# Patient Record
Sex: Male | Born: 1935 | Race: White | Hispanic: No | Smoking: Former smoker
Health system: Southern US, Community
[De-identification: ages and names within clinical notes are randomized; demographics above are authoritative.]

## PROBLEM LIST (undated history)

## (undated) DIAGNOSIS — I480 Paroxysmal atrial fibrillation: Secondary | ICD-10-CM

## (undated) DIAGNOSIS — H547 Unspecified visual loss: Secondary | ICD-10-CM

## (undated) DIAGNOSIS — Z720 Tobacco use: Secondary | ICD-10-CM

## (undated) DIAGNOSIS — C439 Malignant melanoma of skin, unspecified: Secondary | ICD-10-CM

## (undated) DIAGNOSIS — I059 Rheumatic mitral valve disease, unspecified: Secondary | ICD-10-CM

## (undated) HISTORY — PX: PARTIAL HIP ARTHROPLASTY: SHX733

## (undated) HISTORY — DX: Unspecified visual loss: H54.7

## (undated) HISTORY — DX: Tobacco use: Z72.0

## (undated) HISTORY — DX: Malignant melanoma of skin, unspecified: C43.9

## (undated) HISTORY — DX: Rheumatic mitral valve disease, unspecified: I05.9

## (undated) HISTORY — PX: INGUINAL HERNIA REPAIR: SUR1180

## (undated) HISTORY — DX: Paroxysmal atrial fibrillation: I48.0

---

## 1962-11-28 HISTORY — PX: EYE SURGERY: SHX253

## 2004-10-01 ENCOUNTER — Ambulatory Visit: Payer: Self-pay | Admitting: *Deleted

## 2004-10-28 ENCOUNTER — Ambulatory Visit: Payer: Self-pay | Admitting: Cardiology

## 2004-11-25 ENCOUNTER — Ambulatory Visit: Payer: Self-pay | Admitting: *Deleted

## 2004-12-10 ENCOUNTER — Ambulatory Visit: Payer: Self-pay | Admitting: *Deleted

## 2004-12-31 ENCOUNTER — Ambulatory Visit: Payer: Self-pay | Admitting: Internal Medicine

## 2005-01-10 ENCOUNTER — Ambulatory Visit: Payer: Self-pay | Admitting: Cardiology

## 2005-02-07 ENCOUNTER — Ambulatory Visit: Payer: Self-pay | Admitting: *Deleted

## 2005-02-21 ENCOUNTER — Ambulatory Visit: Payer: Self-pay | Admitting: Cardiology

## 2005-03-25 ENCOUNTER — Ambulatory Visit: Payer: Self-pay | Admitting: Cardiology

## 2005-05-06 ENCOUNTER — Ambulatory Visit: Payer: Self-pay | Admitting: Cardiology

## 2005-06-08 ENCOUNTER — Ambulatory Visit: Payer: Self-pay | Admitting: *Deleted

## 2005-07-11 ENCOUNTER — Ambulatory Visit: Payer: Self-pay | Admitting: Cardiology

## 2005-08-12 ENCOUNTER — Ambulatory Visit: Payer: Self-pay | Admitting: Cardiology

## 2005-09-15 ENCOUNTER — Ambulatory Visit: Payer: Self-pay | Admitting: Cardiology

## 2005-10-24 ENCOUNTER — Ambulatory Visit: Payer: Self-pay | Admitting: *Deleted

## 2005-12-05 ENCOUNTER — Ambulatory Visit: Payer: Self-pay | Admitting: *Deleted

## 2006-01-12 ENCOUNTER — Ambulatory Visit: Payer: Self-pay | Admitting: Cardiology

## 2006-02-13 ENCOUNTER — Ambulatory Visit: Payer: Self-pay | Admitting: Internal Medicine

## 2006-03-23 ENCOUNTER — Ambulatory Visit: Payer: Self-pay | Admitting: *Deleted

## 2006-04-17 ENCOUNTER — Ambulatory Visit: Payer: Self-pay | Admitting: *Deleted

## 2006-05-18 ENCOUNTER — Ambulatory Visit: Payer: Self-pay | Admitting: Cardiology

## 2006-06-29 ENCOUNTER — Ambulatory Visit: Payer: Self-pay | Admitting: *Deleted

## 2006-08-03 ENCOUNTER — Ambulatory Visit: Payer: Self-pay | Admitting: Cardiology

## 2006-09-06 ENCOUNTER — Ambulatory Visit: Payer: Self-pay | Admitting: Cardiology

## 2006-10-12 ENCOUNTER — Ambulatory Visit: Payer: Self-pay | Admitting: Cardiology

## 2006-11-08 ENCOUNTER — Ambulatory Visit: Payer: Self-pay | Admitting: Cardiovascular Disease

## 2007-01-15 ENCOUNTER — Ambulatory Visit: Payer: Self-pay | Admitting: Cardiology

## 2007-02-14 ENCOUNTER — Ambulatory Visit: Payer: Self-pay | Admitting: Cardiology

## 2007-03-16 ENCOUNTER — Ambulatory Visit: Payer: Self-pay | Admitting: Internal Medicine

## 2007-03-22 ENCOUNTER — Ambulatory Visit: Payer: Self-pay | Admitting: Cardiology

## 2007-03-26 ENCOUNTER — Ambulatory Visit: Payer: Self-pay | Admitting: Internal Medicine

## 2007-03-28 ENCOUNTER — Ambulatory Visit: Payer: Self-pay | Admitting: Cardiology

## 2007-04-16 ENCOUNTER — Ambulatory Visit: Payer: Self-pay | Admitting: Cardiology

## 2007-06-21 ENCOUNTER — Ambulatory Visit: Payer: Self-pay | Admitting: Cardiology

## 2007-07-23 ENCOUNTER — Ambulatory Visit: Payer: Self-pay | Admitting: Internal Medicine

## 2007-08-13 ENCOUNTER — Ambulatory Visit: Payer: Self-pay | Admitting: Cardiology

## 2007-09-17 ENCOUNTER — Ambulatory Visit: Payer: Self-pay | Admitting: Cardiology

## 2007-10-22 ENCOUNTER — Ambulatory Visit: Payer: Self-pay | Admitting: Cardiology

## 2007-11-29 DIAGNOSIS — C439 Malignant melanoma of skin, unspecified: Secondary | ICD-10-CM

## 2007-11-29 HISTORY — DX: Malignant melanoma of skin, unspecified: C43.9

## 2007-12-05 ENCOUNTER — Ambulatory Visit: Payer: Self-pay | Admitting: Cardiology

## 2008-01-09 ENCOUNTER — Ambulatory Visit: Payer: Self-pay | Admitting: Cardiology

## 2008-01-23 ENCOUNTER — Ambulatory Visit: Payer: Self-pay | Admitting: Cardiovascular Disease

## 2008-02-27 ENCOUNTER — Ambulatory Visit: Payer: Self-pay | Admitting: Cardiology

## 2008-04-01 ENCOUNTER — Ambulatory Visit: Payer: Self-pay | Admitting: Cardiology

## 2008-04-17 ENCOUNTER — Ambulatory Visit: Payer: Self-pay | Admitting: Cardiology

## 2008-04-28 HISTORY — PX: COLONOSCOPY: SHX174

## 2008-04-30 ENCOUNTER — Ambulatory Visit: Payer: Self-pay | Admitting: Cardiology

## 2008-06-04 ENCOUNTER — Ambulatory Visit: Payer: Self-pay | Admitting: Cardiology

## 2008-07-07 ENCOUNTER — Ambulatory Visit: Payer: Self-pay | Admitting: Cardiology

## 2008-08-13 ENCOUNTER — Ambulatory Visit: Payer: Self-pay | Admitting: Cardiology

## 2008-09-11 ENCOUNTER — Ambulatory Visit: Payer: Self-pay | Admitting: Cardiology

## 2008-10-16 ENCOUNTER — Ambulatory Visit: Payer: Self-pay | Admitting: Cardiology

## 2008-11-13 ENCOUNTER — Ambulatory Visit: Payer: Self-pay | Admitting: Cardiology

## 2008-12-24 ENCOUNTER — Ambulatory Visit: Payer: Self-pay | Admitting: Cardiology

## 2009-01-12 ENCOUNTER — Ambulatory Visit: Payer: Self-pay | Admitting: Cardiology

## 2009-02-04 ENCOUNTER — Ambulatory Visit: Payer: Self-pay | Admitting: Cardiology

## 2009-02-19 ENCOUNTER — Ambulatory Visit: Payer: Self-pay | Admitting: Cardiology

## 2009-04-06 ENCOUNTER — Ambulatory Visit: Payer: Self-pay | Admitting: Cardiology

## 2009-05-11 ENCOUNTER — Ambulatory Visit: Payer: Self-pay | Admitting: Cardiology

## 2009-06-12 ENCOUNTER — Ambulatory Visit: Payer: Self-pay | Admitting: Cardiology

## 2009-06-29 ENCOUNTER — Ambulatory Visit: Payer: Self-pay

## 2009-07-13 ENCOUNTER — Encounter: Payer: Self-pay | Admitting: *Deleted

## 2009-07-16 ENCOUNTER — Encounter: Payer: Self-pay | Admitting: Cardiology

## 2009-07-20 ENCOUNTER — Encounter (INDEPENDENT_AMBULATORY_CARE_PROVIDER_SITE_OTHER): Payer: Self-pay | Admitting: *Deleted

## 2009-07-20 LAB — CONVERTED CEMR LAB: OCCULT 1: NEGATIVE

## 2009-08-13 ENCOUNTER — Ambulatory Visit: Payer: Self-pay | Admitting: Cardiology

## 2009-09-14 ENCOUNTER — Ambulatory Visit: Payer: Self-pay | Admitting: Cardiology

## 2009-09-14 LAB — CONVERTED CEMR LAB: POC INR: 2.2

## 2009-11-09 ENCOUNTER — Encounter: Payer: Self-pay | Admitting: Cardiology

## 2010-05-25 ENCOUNTER — Encounter (INDEPENDENT_AMBULATORY_CARE_PROVIDER_SITE_OTHER): Payer: Self-pay | Admitting: *Deleted

## 2010-08-17 ENCOUNTER — Ambulatory Visit: Payer: Self-pay | Admitting: Cardiology

## 2010-08-17 ENCOUNTER — Encounter: Payer: Self-pay | Admitting: Adult Health

## 2010-08-20 ENCOUNTER — Ambulatory Visit: Payer: Self-pay | Admitting: Cardiology

## 2010-08-20 ENCOUNTER — Encounter: Payer: Self-pay | Admitting: Cardiology

## 2010-08-20 ENCOUNTER — Ambulatory Visit (HOSPITAL_COMMUNITY): Admission: RE | Admit: 2010-08-20 | Discharge: 2010-08-20 | Payer: Self-pay | Admitting: Cardiology

## 2010-08-26 ENCOUNTER — Telehealth (INDEPENDENT_AMBULATORY_CARE_PROVIDER_SITE_OTHER): Payer: Self-pay | Admitting: *Deleted

## 2010-08-27 ENCOUNTER — Encounter (INDEPENDENT_AMBULATORY_CARE_PROVIDER_SITE_OTHER): Payer: Self-pay | Admitting: *Deleted

## 2010-09-02 ENCOUNTER — Ambulatory Visit: Payer: Self-pay | Admitting: Cardiology

## 2010-09-02 DIAGNOSIS — H548 Legal blindness, as defined in USA: Secondary | ICD-10-CM | POA: Insufficient documentation

## 2010-09-02 DIAGNOSIS — C4330 Malignant melanoma of unspecified part of face: Secondary | ICD-10-CM | POA: Insufficient documentation

## 2010-09-02 DIAGNOSIS — I059 Rheumatic mitral valve disease, unspecified: Secondary | ICD-10-CM | POA: Insufficient documentation

## 2010-10-20 ENCOUNTER — Ambulatory Visit: Payer: Self-pay | Admitting: Cardiology

## 2010-10-22 ENCOUNTER — Encounter: Payer: Self-pay | Admitting: Cardiology

## 2010-12-26 LAB — CONVERTED CEMR LAB
ALT: 17 units/L
AST: 21 units/L
Alkaline Phosphatase: 50 units/L
CO2: 23 meq/L
Chloride: 104 meq/L
HCT: 42.8 %
Hemoglobin: 14 g/dL
WBC: 4.9 10*3/uL

## 2010-12-28 NOTE — Progress Notes (Signed)
Summary: Results of Echo  Phone Note Call from Patient   Caller: Patient Reason for Call: Lab or Test Results Summary of Call: patient would like results of 2 D Echo/tg Initial call taken by: Raechel Ache [facility],  August 26, 2010 11:28 AM  Follow-up for Phone Call        results given, pt verbalized understanding.  MD will discuss further at ov Follow-up by: Teressa Lower RN,  August 26, 2010 2:03 PM
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
## 2010-12-28 NOTE — Miscellaneous (Signed)
Summary: ECHO 08/17/2010  Clinical Lists Changes  Observations: Added new observation of ECHOINTERP:   Study Conclusions    - Left ventricle: The cavity size was normal. Wall thickness was     increased in a pattern of mild LVH. The estimated ejection     fraction was 55%. Although no diagnostic regional wall motion     abnormality was identified, this possibility cannot be completely     excluded on the basis of this study. The study is not technically     sufficient to allow evaluation of LV diastolic function.   - Mitral valve: Mild regurgitation.   - Left atrium: The atrium was mildly dilated.   - Tricuspid valve: Mild regurgitation.   - Pericardium, extracardiac: There was no pericardial effusion.   Transthoracic echocardiography. M-mode, complete 2D, spectral   Doppler, and color Doppler. Height: Height: 190.5cm. Height: 75in.   Weight: Weight: 97.1kg. Weight: 213.6lb. Body mass index: BMI:   26.7kg/m^2. Body surface area: BSA: 2.58m^2. Patient status:   Outpatient. Location: Echo laboratory.    --------------------------------------------------------------------  (08/20/2010 10:29)      Echocardiogram  Procedure date:  08/20/2010  Findings:        Study Conclusions    - Left ventricle: The cavity size was normal. Wall thickness was     increased in a pattern of mild LVH. The estimated ejection     fraction was 55%. Although no diagnostic regional wall motion     abnormality was identified, this possibility cannot be completely     excluded on the basis of this study. The study is not technically     sufficient to allow evaluation of LV diastolic function.   - Mitral valve: Mild regurgitation.   - Left atrium: The atrium was mildly dilated.   - Tricuspid valve: Mild regurgitation.   - Pericardium, extracardiac: There was no pericardial effusion.   Transthoracic echocardiography. M-mode, complete 2D, spectral   Doppler, and color Doppler. Height: Height: 190.5cm.  Height: 75in.   Weight: Weight: 97.1kg. Weight: 213.6lb. Body mass index: BMI:   26.7kg/m^2. Body surface area: BSA: 2.59m^2. Patient status:   Outpatient. Location: Echo laboratory.    --------------------------------------------------------------------
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
## 2010-12-28 NOTE — Letter (Signed)
Summary: Appointment - Reminder 2  [facility] [place]. [address] [place] [address]   Phone: [phone]  Fax: [phone]     May 25, 2010 MRN: [id number]   Curtis Mckinney [address] [place], [place]  [address]   Dear [name],  Our records indicate that it is time to schedule a follow-up appointment.  Dr.   Dietrich Pates       recommended that you follow up with [place] in   MAY 2011         . It is very important that we reach you to schedule this appointment. We look forward to participating in your health care needs. Please contact us at the number listed above at your earliest convenience to schedule your appointment.  If you are unable to make an appointment at this time, give [place] a call so we can update our records.     Sincerely,   Glass blower/designer

## 2010-12-28 NOTE — Assessment & Plan Note (Signed)
Summary: 1 MTH NURSE VISIT W/ RHYTHM STRIP PER CHECKOUT ON 09/02/10/TG  Nurse Visit   Vital Signs:  Patient profile:   77 year old male Height:      75 inches Weight:      221 pounds O2 Sat:      97 % on Room air Pulse rate:   83 / minute BP sitting:   121 / 78  (left arm)  Vitals Entered By: Teressa Lower RN (October 20, 2010 4:39 PM)  O2 Flow:  Room air  Visit Type:  1 month nurse visit Primary Jesenya Bowditch:  Dr. Renato Gails at [facility]   History of Present Illness: S: 1 month nurse visit B: office visit on 09/02/2010, started verapamil180mg  daily A: no c/o other than shortness of breath on exertion R: continue current medications and call for worsening shortness of breath, pt is interested in a cardioversion after the New Year  10/26/10 Rhythm strip reviewed and demonstrates atrial fibrillation with a controlled ventricular response. Patient is doing well symptomatically. Blood pressure is good. Current medical therapy will be maintained. Cardioversion could be undertaken at any time.  Patient appears to be overdue for a Coumadin clinic visit.  Goal INR should be 2.5 preceding cardioversion.  [place] Bing, M.D.      Allergies: No Known Drug Allergies

## 2011-04-12 NOTE — Letter (Signed)
Apr 06, 2009    [facility]  Dr. Sheran Fava  [address]  [place], [place] [address]   RE:  Curtis Mckinney, Curtis Mckinney  MRN:  [id number]  /  DOB:  [birth year]   Dear Dr. Sheran Fava:   Curtis Mckinney returns to the office as scheduled for annual followup of  paroxysmal atrial fibrillation.  He has a history of borderline MVP  without significant regurgitation.  He has been chronically  anticoagulated without incident.  His only other significant problem has  been a stage I melanoma resected from his left forehead.  He continues  to be followed by a dermatologist.  He reports chronic abdominal  discomfort that has been evaluated in the past without specific  diagnosis.  The pain is intermittent and typically under his right  costal margin or more in the epigastric region.  It is mild-to-moderate  in intensity.  There is no radiation.  There are no associated symptoms.  It improves when he moves around.   Current medications include warfarin with stable therapeutic  anticoagulation managed in our office, verapamil 120 mg daily, calcium  and vitamin D daily, and vitamin B12.   PHYSICAL EXAMINATION:  GENERAL:  On exam, pleasant, thin, tall gentleman  in no acute distress.  VITAL SIGNS:  The weight is 220 pounds, 4 pounds less than the last  year.  Blood pressure 105/65, heart rate 55 and irregular, respirations  12 and unlabored.  HEENT:  Opaque corneas.  SKIN:  No significant lesions.  NECK:  No jugular venous distention.  LUNGS:  Clear.  CARDIAC:  Normal first and second heart sounds; irregular rhythm.  ABDOMEN:  Soft and nontender; no organomegaly.  EXTREMITIES:  Trace edema; mild chronic skin changes.   IMPRESSION:  Curtis Mckinney continues to be perfectly stable in atrial  fibrillation.  We will obtain a CBC and stool for hemoccult testing due  to chronic anticoagulation.  Both of these were normal last year.  I  will see this nice gentleman again in 1 year.     Sincerely,      Gerrit Friends. Dietrich Pates, MD, [facility]  Electronically Signed    RMR/MedQ  DD: 04/06/2009  DT: 04/07/2009  Job #: [id number]

## 2011-04-12 NOTE — Letter (Signed)
Apr 01, 2008    Patrica Duel, M.D.  [address]  [place], [place] [address]   RE:  Curtis, Mckinney  MRN:  [id number]  /  DOB:  1929/02/18   Dear Loraine Leriche:   Curtis Mckinney returns to the office after a 1 year absence for continued  assessment and treatment of paroxysmal atrial fibrillation.  He remains  extraordinarily healthy with no significant intercurrent medical  problems except for the discovery of a stage I melanoma on his left  forehead which is soon to be resected at the [facility].  He reports no  dyspnea nor chest discomfort.  He continues to have episodic  palpitations, but these are improved since he started taking verapamil.  He correlates episodes of arrhythmia to episodes of constipation.   CURRENT MEDICATIONS:  1. Warfarin with adjustment in our anticoagulation clinic.  2. Verapamil 120 mg daily.   PHYSICAL EXAMINATION:  GENERAL:  Pleasant proportionate gentleman in no  acute distress.  VITAL SIGNS:  The weight is 224, 8 pounds less than last year.  Blood  pressure 100/60, heart rate 65 and irregular, respirations 18.  NECK:  No jugular venous distention; no carotid bruits.  LUNGS:  Clear.  CARDIAC:  Irregular rhythm; distant first and second heart sounds.  ABDOMEN:  Soft and nontender; normal bowel sounds; no organomegaly.  EXTREMITIES:  No edema; distal pulses intact.   IMPRESSION:  Curtis Mckinney is doing very well with paroxysmal atrial  fibrillation, chronic anticoagulation and rate control with low dose of  verapamil.  We will check a CBC and stool for hemoccult testing due to  chronic anticoagulation.  He underwent colonoscopy last year with only a  small polyp found that was benign.  Vaccinations are up to date.  I will  plan to see this nice gentleman again in 1 year.    Sincerely,      Gerrit Friends. Dietrich Pates, MD, [facility]  Electronically Signed    RMR/MedQ  DD: 04/01/2008  DT: 04/01/2008  Job #: [id number]

## 2011-04-15 NOTE — Letter (Signed)
March 22, 2007    Curtis Mckinney, M.D.  [address]  [place], [place] [address]   RE:  [name], [name]  MRN:  [id number]  /  DOB:  Sep 02, 1929   Dear Loraine Leriche:   Curtis Mckinney returns to the office today as requested when he noted  prolonged palpitations.  These have been present for an hour or so.  Rhythm strip documents atrial fibrillation with a rapid ventricular  response.  Heart rate is approximately 135.  Curtis Mckinney reports  minimal orthostatic lightheadedness with this arrhythmia, and some  decline in exercise tolerance.   EXAMINATION:  A pleasant gentleman, in no acute distress.  The weight is 229, stable.  Blood pressure 90/70.  Heart rate rapid and  irregular.  LUNGS:  Clear.  There is no jugular venous distention.  CARDIAC:  Exam reveals a rapid irregular rhythm with normal 1st and 2nd  heart sounds, and no murmur.   IMPRESSION:  Curtis Mckinney now has documented recurrent atrial  fibrillation.  He is convinced that he senses every episode, but we  cannot be certain that this is the case.  Accordingly, he will need  continued anticoagulation.  I also suggested low-dose diltiazem, which  could possibly result in adverse effects related to low blood pressure  or bradycardia, but will  tend to slow his rhythm when he is in atrial fibrillation, and perhaps  abolish or attenuate symptoms.  He is not inclined to take that  medication, but will consider it.  If he does start diltiazem, we will  check his rhythm and symptoms again on Monday.  If not, I will see him  again in September as planned.    Sincerely,      Gerrit Friends. Dietrich Pates, MD, [facility]  Electronically Signed    RMR/MedQ  DD: 03/22/2007  DT: 03/22/2007  Job #: [id number]

## 2011-04-15 NOTE — Letter (Signed)
February 14, 2007    Patrica Duel, M.D.  [address], [address]  [place], [place] [address]   RE:  Curtis, Mckinney  MRN:  [id number]  /  DOB:  09-01-1929   Dear Loraine Leriche:   Curtis Mckinney returns to the office for continued assessment and  treatment of paroxysmal atrial fibrillation.  On review of his chart, I  note that we have no documentation of an arrhythmia for the past 8  years.  The patient notes intermittent episodes of palpitations lasting  approximately 2 hours.  He never has a prolonged event.  He notes some  vague chest discomfort with this arrhythmia.   His only medication is warfarin.   PHYSICAL EXAMINATION:  GENERAL:  On exam, a pleasant gentleman in no  acute distress.  VITAL SIGNS:  The weight is 232, 4 pounds more than last year.  Blood  pressure 115/75, heart rate 64 and regular, respirations 16.  NECK:  No jugular venous distention.  HEENT:  Opacified corneas.  LUNGS:  Clear.  CARDIAC:  Normal first and second heart sounds.  ABDOMEN:  Soft and nontender; no organomegaly.  EXTREMITIES:  1+ edema.   IMPRESSION:  Curtis Mckinney is doing generally well.  We will check stool  for Hemoccult testing, a chemistry profile and a CBC based upon his  chronic anticoagulation.  Even if he does have episodic atrial  fibrillation, his risk for thromboembolism is relatively modest.  I have  asked  him to keep a record of events and duration and to come to the office  for an EKG if one occurs during office hours.  I will see him again in 6  months, at which time I will probably discontinue warfarin.  He has mild  edema related to venous insufficiency.  He will minimize salt intake and  maintain leg elevation when inactive.    Sincerely,      Gerrit Friends. Dietrich Pates, MD, [facility]  Electronically Signed    RMR/MedQ  DD: 02/14/2007  DT: 02/14/2007  Job #: [id number]

## 2011-11-25 ENCOUNTER — Encounter: Payer: Self-pay | Admitting: Cardiology

## 2012-03-20 ENCOUNTER — Encounter: Payer: Self-pay | Admitting: Cardiology

## 2013-05-01 ENCOUNTER — Inpatient Hospital Stay
Admission: RE | Admit: 2013-05-01 | Discharge: 2013-05-16 | Disposition: A | Discharge status: Home / Self Care | Payer: Medicare Other | Source: Ambulatory Visit | Attending: Internal Medicine | Admitting: Internal Medicine

## 2013-05-01 DIAGNOSIS — R609 Edema, unspecified: Principal | ICD-10-CM

## 2013-05-02 ENCOUNTER — Non-Acute Institutional Stay (SKILLED_NURSING_FACILITY): Payer: Medicare Other | Admitting: Internal Medicine

## 2013-05-02 DIAGNOSIS — H547 Unspecified visual loss: Secondary | ICD-10-CM

## 2013-05-02 DIAGNOSIS — I4891 Unspecified atrial fibrillation: Secondary | ICD-10-CM

## 2013-05-02 DIAGNOSIS — R609 Edema, unspecified: Secondary | ICD-10-CM

## 2013-05-02 DIAGNOSIS — S72009D Fracture of unspecified part of neck of unspecified femur, subsequent encounter for closed fracture with routine healing: Secondary | ICD-10-CM

## 2013-05-02 DIAGNOSIS — K5900 Constipation, unspecified: Secondary | ICD-10-CM

## 2013-05-02 DIAGNOSIS — I480 Paroxysmal atrial fibrillation: Secondary | ICD-10-CM | POA: Insufficient documentation

## 2013-05-02 DIAGNOSIS — S72141D Displaced intertrochanteric fracture of right femur, subsequent encounter for closed fracture with routine healing: Secondary | ICD-10-CM

## 2013-05-02 DIAGNOSIS — H543 Unqualified visual loss, both eyes: Secondary | ICD-10-CM

## 2013-05-02 DIAGNOSIS — S72141A Displaced intertrochanteric fracture of right femur, initial encounter for closed fracture: Secondary | ICD-10-CM | POA: Insufficient documentation

## 2013-05-02 DIAGNOSIS — J309 Allergic rhinitis, unspecified: Secondary | ICD-10-CM
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
## 2013-05-03 ENCOUNTER — Non-Acute Institutional Stay (SKILLED_NURSING_FACILITY): Payer: Medicare Other | Admitting: Internal Medicine

## 2013-05-03 ENCOUNTER — Ambulatory Visit (HOSPITAL_COMMUNITY)
Admit: 2013-05-03 | Discharge: 2013-05-03 | Disposition: A | Discharge status: Home / Self Care | Payer: Medicare Other | Source: Skilled Nursing Facility | Attending: Internal Medicine | Admitting: Internal Medicine

## 2013-05-03 DIAGNOSIS — K219 Gastro-esophageal reflux disease without esophagitis: Secondary | ICD-10-CM

## 2013-05-03 DIAGNOSIS — I4891 Unspecified atrial fibrillation: Secondary | ICD-10-CM

## 2013-05-03 DIAGNOSIS — Z7901 Long term (current) use of anticoagulants: Secondary | ICD-10-CM

## 2013-05-03 DIAGNOSIS — M7989 Other specified soft tissue disorders: Secondary | ICD-10-CM | POA: Insufficient documentation

## 2013-05-05 NOTE — Progress Notes (Signed)
Patient ID: Curtis Mckinney, male   DOB: 10/08/1929, 79 y.o.   MRN: [id number]  This is an acute visit.  Level of care skilled.  Facility   Chief complaint-acute visit secondary to  atrial fibrillation with elevated heart rate-also elevated INR on Coumadin   Patient is a very pleasant elderly male here for rehabilitation after a right hip fracture.  He also has a history of atrial fibrillation--- has been on chronic Coumadin.  His INR is elevated today at 3.21 he is on 7.5 mg a day except for Mondays when he receives 5 mg.  There's been no increased bruising or bleeding.  Another issue is his atrial fibrillation which appears to have an elevated heart rate-today it is running near 120.  He denies any increase shortness of breath chest pain or syncopal-type feelings.  He is on verapamil extended release 180 mg a day.  Family medical social history as been reviewed per progress note on 05/02/2013.  Medications have been reviewed per MAR.  Review of systems.  General denies any fever or chills.  Respiratory does not complaining of shortness of breath or cough.  Cardiac-denies any chest pain palpitations.  GI-does not complaining of any nausea vomiting diarrhea or constipation he does state after he eats he has a fullness  in the sternal area that is relieved with burping  Muscle skeletal-at this point hip leg pain is controlled on current medications.  Neurologic-does not really complain of any headache dizziness.  Physical exam.  Temperature is 97.4 pulse 1:15 respirations 22 blood pressure 138/89-122/80 taken manually.  In general this is a very pleasant elderly male in no distress lying comfortably in bed.  The skin is warm and dry.--No evidence of increased bruising or bleeding  Chest is clear to auscultation without rhonchi rales or wheezes.  Heart is tachycardic at 115 beats a minute apical-without murmur gallop or rub.  He continues to have some right leg  edema versus the left there is a positive pedal pulse.  Abdomen is soft nontender positive bowel sounds.  Muscle skeletal was all extremities except limited certainly right leg secondary to recent fracture.  Neurologic is grossly intact no lateralizing findings his speech is clear.  Psych--alert and oriented x3 pleasant and appropriate.  Labs.  05/03/2013.  WBC 6.8 hemoglobin 10.6 platelets 276    -glucose a metabolic panel was 122.  Sodium 137 potassium 4.5 BUN 16 creatinine 0.83  INR-3.21.  Assessment and plan.  #1-A. fib-the rate appears to be elevated-we'll increase the verapamil to 240 mg a day and monitor blood pressures and pulse every shift-clinically he appears stable-this was discussed with Dr. Leanord Hawking via phone.  #2-anticoagulation management-INR is elevated we'll hold Coumadin tonight and recheck tomorrow clinically appears stable no evidence of bruising or bleeding  #3-question GERD-patient appears to have some GERD-like findings after eating-Will start Prilosec 20 mg a day and monitor.  [id number] note 30 minutes spent assessing patient t as well as formulating plan of care for numerous diagnoses

## 2013-05-06 ENCOUNTER — Non-Acute Institutional Stay (SKILLED_NURSING_FACILITY): Payer: Medicare Other | Admitting: Internal Medicine

## 2013-05-06 DIAGNOSIS — S72001D Fracture of unspecified part of neck of right femur, subsequent encounter for closed fracture with routine healing: Secondary | ICD-10-CM

## 2013-05-06 DIAGNOSIS — R609 Edema, unspecified: Secondary | ICD-10-CM

## 2013-05-06 DIAGNOSIS — I4891 Unspecified atrial fibrillation: Secondary | ICD-10-CM

## 2013-05-06 DIAGNOSIS — S72009D Fracture of unspecified part of neck of unspecified femur, subsequent encounter for closed fracture with routine healing: Secondary | ICD-10-CM

## 2013-05-06 NOTE — Progress Notes (Signed)
Patient ID: Curtis Mckinney, male   DOB: 08-26-1929, 79 y.o.   MRN: [id number] Facility; [facility]. Chief complaint; admission to SNF post admission to the [facility]. Exact dates uncertain.  History: This is an 79 year old man who is totally blind, but lives in his own home in [place]. The patient fell while walking in his own home. He fractured his right hip and apparently underwent a partial right hip replacement. I have no information on his postoperative course. He came here with the tachycardia from uncontrolled atrial fibrillation. He has already had a duplex ultrasound of the right leg. That was negative for DVT. We increased his Verapamil SR from 180 mg a day to 240 mg a day. However, this still does not seem to of controlled his heart rate. He does not have a history of recurrent falls, gait ataxia, or osteoporosis.  Past medical history; #1 persistent atrial fibrillation since at least 2009. He has been on chronic Coumadin and verapamil SA. His last echocardiogram on [facility] in 2011 showed an ejection fraction of 55%, mild LVH, and no significant valvular heart disease. He does not. A diagnosis of hypertension. #2 long-standing bilateral total blindness. #3 on long-standing Coumadin. #4 Allergic Rhinitis  Medications; calcium plus D6 100/400, one by mouth daily, Colace, 100 mg by mouth 3 times a day, flunisolide 0.025% 2 sprays each nostril twice a day, ranitidine, 10 mg daily, Rapamune 0SA 180 mg on admission now at 240 mg since last week, Coumadin 5 mg on Monday and 7.5 mg other days. Current INR is 2.29 as of today.  Social history; patient lives in [place] in his own home. Has some help for outside activities from nearby family members, including his son and his sister-in-law. He does not use a walker. Has a cane for the blind. Non smoker.  Review of systems; Respiratory; no cough. No wheezing. Does have exertional shortness of breath, which may have been worsening over the course of  this year. Cardiac no clear exertional chest pain, palpitations. GI no nausea, vomiting, diarrhea. Extremities some edema.  Physical exam; pulse rate from 110-125, O2 sat 98% on room air, respirations 16, blood pressure 126 systolic. Gen. bright pleasant man in no overt distress. Does seem to be short of breath with reasonably minimal exertion Respiratory; totally clear entry bilaterally. No wheezing, crackles. Worker breathing is normal. Cardiac; mild left parasternal lift. Heart rate 112 are irregular, irregular. No S3, jugular venous pressure is not elevated. No murmurs. Abdomen no liver no spleen no tenderness. GU bladder is not distended. No CVA tenderness. Extremities mild venous stasis, mild, right greater than left leg edema. No clinical signs of a DVT. Peripheral pulses are intact. Rt. Hip incision looks fine. Staples still in.  Neurologic; gait is nondescript, does well with a walker.  Impression/plan #1 status post apparent right partial hip replacement; I don't actually have the description of the surgery. This is from his sister-in-law. #2 poorly controlled atrial fibrillation; this is in spite of increasing Verapamil SR. Consider addition of Lopressor. #3 prior history of decreasing exercise tolerance even prior to this fracture. This could have been his atrial fibrillation has been out of control longer than we are aware, would also worry about an anginal equivalent.  #4 mild LVH on echocardiogram in 2011, but no history of hypertension. #5 lower extremity edema, which is probably venous stasis. Duplex ultrasound of the right leg was negative for DVT. #6 on Coumadin in the therapeutic range.  I'm going to  add Lopressor 12.5 twice a day. Arrange followup with cardiology, he has seen Dr. Dietrich Pates in the past. Will arrange this if the patient okay with this, apparently has been seen by cardiology at the [facility], although I don't have the name.

## 2013-05-07 ENCOUNTER — Non-Acute Institutional Stay (SKILLED_NURSING_FACILITY): Payer: Medicare Other | Admitting: Internal Medicine

## 2013-05-07 DIAGNOSIS — I4891 Unspecified atrial fibrillation: Secondary | ICD-10-CM

## 2013-05-07 NOTE — Progress Notes (Signed)
Patient ID: Curtis Mckinney, male   DOB: 06-Sep-1929, 79 y.o.   MRN: [id number]  This is an acute visit.  Level of care skilled.  Facility [facility].  Chief complaint-acute visit followup atrial fibrillation with rapid ventricular rate.  History of present illness.  Patient is a very pleasant elderly male here for rehabilitation after sustaining a right hip fracture.  He does have a history of atrial fibrillation-this has been controlled on verapamil.  Since his admission here he was tachycardic and late last week did increase his verapamil to 240 mg a day.  Apparently yesterday he developed significant tachycardia apparently rising to near 130-Lopressor 12.5 mg was prescribed twice a day.  Apparently however later patient became bradycardic and he did complain of dizziness-order was made to hold her Lopressor  Apparently since then patient at times has become tachycardic although now this appears to be under control with rate in the 80s.  His blood pressure continues to be satisfactory most recently noted 124/80.  He is not complaining of any dizziness or increased weakness today he is lying comfortably in bed and apparently did fairly well during therapy earlier this morning.  Family medical social history has been reviewed.  Medications have been reviewed per MAR.  Review of systems.  General denies fever or chills.  Respiratory does not complaining of shortness of breath.  Cardiac-does not complaining of palpitations or chest pain.  GI-no complaints of nausea vomiting diarrhea constipation.  Physical exam.  He is afebrile pulse is 87 respirations 18 blood pressure most recently noted to have 116/80.  In general this is a somewhat frail elderly male in no distress resting comfortably in bed.  The skin is warm and dry.  Heart is irregular irregular rate and rhythm I did  Get  in the 80s-he does continue to have some right leg edema a Doppler was negative for any  DVT.  His pedal pulse is intact and quite vigorous  .  Labs.  01/06/2013.  INR 2.29.  Marland Kitchen Assessment plan.  #1-atrial fibrillation at this point appears to be rate controlled on exam I got in the 80s apparently he does go up to 100 at times but I did not see much over 100 and this will need to be monitored his blood pressures appear to be stable clinically he appears to be stable he does have a cardiology consult pending--we will continue to have to keep a close eye on this.  He is on Coumadin for anticoagulation INRs therapeutic update INR has been ordered  . [id number]

## 2013-05-08 ENCOUNTER — Non-Acute Institutional Stay (SKILLED_NURSING_FACILITY): Payer: Medicare Other | Admitting: Internal Medicine

## 2013-05-08 DIAGNOSIS — R609 Edema, unspecified: Secondary | ICD-10-CM

## 2013-05-08 DIAGNOSIS — I4891 Unspecified atrial fibrillation: Secondary | ICD-10-CM

## 2013-05-15 ENCOUNTER — Non-Acute Institutional Stay (SKILLED_NURSING_FACILITY): Payer: Medicare Other | Admitting: Internal Medicine

## 2013-05-15 DIAGNOSIS — R609 Edema, unspecified: Secondary | ICD-10-CM

## 2013-05-15 DIAGNOSIS — S72141D Displaced intertrochanteric fracture of right femur, subsequent encounter for closed fracture with routine healing: Secondary | ICD-10-CM

## 2013-05-15 DIAGNOSIS — K219 Gastro-esophageal reflux disease without esophagitis: Secondary | ICD-10-CM

## 2013-05-15 DIAGNOSIS — I4891 Unspecified atrial fibrillation: Secondary | ICD-10-CM

## 2013-05-15 DIAGNOSIS — S72009D Fracture of unspecified part of neck of unspecified femur, subsequent encounter for closed fracture with routine healing: Secondary | ICD-10-CM

## 2013-05-15 DIAGNOSIS — H548 Legal blindness, as defined in USA: Secondary | ICD-10-CM
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
## 2013-05-21 ENCOUNTER — Encounter: Payer: Self-pay | Admitting: Adult Health

## 2013-05-21 ENCOUNTER — Ambulatory Visit (INDEPENDENT_AMBULATORY_CARE_PROVIDER_SITE_OTHER): Payer: Medicare Other | Admitting: Adult Health

## 2013-05-21 VITALS — BP 124/74 | HR 72 | Ht 75.0 in | Wt 225.1 lb

## 2013-05-21 DIAGNOSIS — I059 Rheumatic mitral valve disease, unspecified: Secondary | ICD-10-CM

## 2013-05-21 DIAGNOSIS — I4891 Unspecified atrial fibrillation: Secondary | ICD-10-CM
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
## 2013-05-21 NOTE — Assessment & Plan Note (Signed)
Currently asymptomatic. I am hearing a systolic murmur, but it is soft and not contributing to any symptoms or fluid retention.

## 2013-05-21 NOTE — Patient Instructions (Addendum)
Your physician recommends that you schedule a follow-up appointment in: 3 to 4 Months  Your physician recommends that you continue on your current medications as directed. Please refer to the Current Medication list given to you today.
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
## 2013-05-22 NOTE — Progress Notes (Signed)
Patient ID: ANTWAUN BUTH, male   DOB: November 05, 1929, 79 y.o.   MRN: [id number]           PROGRESS NOTE  DATE:  05/08/2013  FACILITY: [facility]   LEVEL OF CARE:   SNF   Acute Visit   CHIEF COMPLAINT:  Edema, atrial fibrillation.    HISTORY OF PRESENT ILLNESS:  Mr. Maciver is a 79 year-old man who had a partial right total hip replacement at the [facility].    He has a history of chronic atrial fibrillation and has been on chronic Coumadin.  When I saw him for the first time two days ago, he was consistently running a heart rate in the 110-125 range.  I gave him a dose of Lopressor 12.5 whereupon he promptly dropped his pulse rate down into the 50s and became lightheaded, although his blood pressure was reasonably stable at 105 systolic.  We had already increased his verapamil from 180 to 240 a day.  REVIEW OF SYSTEMS:   CHEST/RESPIRATORY:  He is not complaining of shortness of breath.   CARDIAC:   No chest pain.   CIRCULATION:  He is complaining of right greater than left leg edema.  We have already done a duplex ultrasound here which was negative for DVT.    PHYSICAL EXAMINATION:   VITAL SIGNS:   PULSE:  93, atrial fibrillation.   O2 SATURATIONS:  96% on room air.   CARDIOVASCULAR:  CARDIAC:   Heart sounds are irregular.  There are no murmurs, no gallops.  No increase in jugular venous pressure.   EDEMA/VARICOSITIES:  Extremities:  He does have venous stasis and probably chronic venous insufficiency.  There is edema of the right greater than left leg.  Some of this is probably postoperative, some of it venous insufficiency.  Nevertheless, he is complaining about this quite a bit.  I will put him on a tiny dose of Lasix.    ASSESSMENT/PLAN:  Atrial fibrillation.  I am not going to add Lopressor back to this although if his heart rate goes up again, I will probably try him on a dose of 6.25 mg.  Digoxin would appear to be an alternative.  I will leave him on verapamil at  240 which he appears to be tolerating well.  Whether this is really going to control his heart rate or not, I am uncertain.  His Coumadin is therapeutic at 2.29.  He is on 5 mg on Mondays and 7.5 other days.    Edema.  I think most of this is venous insufficiency, some of this probably postoperative on the right.  I am going to give him Lasix 10 mg.  TED hose would be helpful.    CPT CODE: 56213

## 2013-05-24 ENCOUNTER — Telehealth: Payer: Self-pay | Admitting: Adult Health

## 2013-05-24 NOTE — Telephone Encounter (Signed)
Patient states that his BP has been running up and down since Verapamil has been increased.  Wants to know what he should do. / tgs

## 2013-05-24 NOTE — Telephone Encounter (Signed)
Called Curtis Mckinney to confirm current signs and symptoms Curtis Mckinney notes BP going up and down on and off since he was in the [facility], noted his verapamil was increased at [facility] to 240mg  once daily 2 weeks ago, noted his BP was 98/68 yesterday morning, [facility] and unable to contact yesterday, Curtis Mckinney notes swelling in his right foot, advised not profound but has a hx of the swelling in this foot per Curtis Mckinney keeps elevated as advised prior, notes making numerous trips to the bathroom and is drinking water however not in excess, noted BP at 3am this morning at 158/104 Hr 102 per up to use the restroom, noted went back to bed and settled, BP ago was noted 103/56 HR 72, Curtis Mckinney denies chest/arm/neck/jaw pain, Curtis Mckinney does advise excess of indigestion, even with drinking water this symptom has been ongoing daily since 04-24-13 with his fall, Curtis Mckinney denies SOB/dizzyness/headaches, Curtis Mckinney notes he is totally blind so lightheadness is common for him from time to time, please advise in absence of Dr RR. Curtis Mckinney advised If your signs and symptoms worsen please seek medical attention in your local Emergency Room, do not attempt to drive yourself per further endangerment, call 911 for transport or have someone drive you to the ER as soon as possible.

## 2013-05-27 ENCOUNTER — Ambulatory Visit (INDEPENDENT_AMBULATORY_CARE_PROVIDER_SITE_OTHER): Payer: Medicare Other | Admitting: *Deleted

## 2013-05-27 DIAGNOSIS — I4891 Unspecified atrial fibrillation: Secondary | ICD-10-CM

## 2013-05-27 DIAGNOSIS — Z7901 Long term (current) use of anticoagulants: Secondary | ICD-10-CM

## 2013-05-27 LAB — POCT INR: INR: 2.9

## 2013-06-13 ENCOUNTER — Telehealth: Payer: Self-pay | Admitting: Adult Health

## 2013-06-13 NOTE — Telephone Encounter (Signed)
Patient states he is having BP issues. / tgs

## 2013-06-13 NOTE — Telephone Encounter (Signed)
Agree with advise concerning chest pain should it return or worsen to go to ER. He should keep follow up appts as scheduled. Call back if BP is elevated once batteries are replaced.

## 2013-06-13 NOTE — Telephone Encounter (Signed)
Called pt to confirm current signs and symptoms, notes he is having headaches that may be coming from stress, also notes light chest pressure times one 30 minutes ago for approximately 30 seconds  however pt also notes possible indigestion per currently burping with drinking water, took BP while on phone noted 108/66 HR75, notes BP cuff batteries have not been changed in a long time and feels that is why his BP readings have been varied, pt will change batteries asap, pt denies SOB,pt denies swelling, pt does not have nitro RX at this time, pt made aware no available apts until 07-02-13 per [facility] rep, If your signs and symptoms worsen please seek medical attention in your local Emergency Room, do not attempt to drive yourself per further endangerment, call 911 for transport or have someone drive you to the ER as soon as possible, pt declined ED evaluation per feels this maybe due to stress but not sure, please advise

## 2013-06-13 NOTE — Telephone Encounter (Signed)
Spoke to pt to advise results/instructions. Pt understood. Pt noted this subsided and he did replace the batteries Pt will call back to our office with any further concerns if necessary

## 2013-06-17 ENCOUNTER — Telehealth: Payer: Self-pay

## 2013-06-17 NOTE — Telephone Encounter (Signed)
Patient called and states that he was in ER at [facility] last Friday 07/18 for Afib.  He was asking about records being received from [facility], advised I would see if we can obtain.  Patient thought appt was for the 5th of Aug.  Confirmed appt for 18th of Aug.  If patient needs to be seen sooner, patient can come in, however he will be in [place] 08/06 to 08/14.

## 2013-06-18 NOTE — Telephone Encounter (Signed)
Contacted pt to advise the paperwork has not been received at this point, however another call was made to have the information faxed over to have an MD review for need for earlier apt, pt notes he is not having any sxs of concern at this time, will await fax for review

## 2013-06-19 NOTE — Telephone Encounter (Signed)
Dr Dionicia Abler gave instructions for pt to be seen prior to his traveling to make sure all is ok with the pt, noted report from [facility] reviewed, advised to overbook for pt, YL scheduled pt for 07-01-13 at 11:40am with [facility] NP, called pt to advise apt change unable to contact, left vm to advise new apt date and to call office that he can confirm this apt

## 2013-06-19 NOTE — Telephone Encounter (Signed)
Pt aware of apt and accepted 07-01-13 at 11:40am

## 2013-07-01 ENCOUNTER — Ambulatory Visit: Payer: Medicare Other | Admitting: Adult Health

## 2013-07-02 ENCOUNTER — Ambulatory Visit (INDEPENDENT_AMBULATORY_CARE_PROVIDER_SITE_OTHER): Payer: Medicare Other | Admitting: Cardiovascular Disease

## 2013-07-02 ENCOUNTER — Encounter: Payer: Self-pay | Admitting: Cardiovascular Disease

## 2013-07-02 VITALS — BP 122/74 | HR 60 | Ht 75.0 in | Wt 210.5 lb

## 2013-07-02 DIAGNOSIS — H548 Legal blindness, as defined in USA: Secondary | ICD-10-CM

## 2013-07-02 DIAGNOSIS — I10 Essential (primary) hypertension: Secondary | ICD-10-CM

## 2013-07-02 DIAGNOSIS — I4891 Unspecified atrial fibrillation: Secondary | ICD-10-CM

## 2013-07-02 NOTE — Assessment & Plan Note (Signed)
Will get 34 hour holter when he comes back from [place] to assess rate control.  May need to add low dose beta blocker to verapamil  Despite age and blindness he is a reasonable candidate to continue coumadin to prevent stroke

## 2013-07-02 NOTE — Patient Instructions (Addendum)
Your physician recommends that you schedule a follow-up appointment in: 3 MONTHS  Your physician has recommended that you wear a holter monitor. Holter monitors are medical devices that record the heart's electrical activity. Doctors most often use these monitors to diagnose arrhythmias. Arrhythmias are problems with the speed or rhythm of the heartbeat. The monitor is a small, portable device. You can wear one while you do your normal daily activities. This is usually used to diagnose what is causing palpitations/syncope (passing out).THE WEEK OF AUGUST 18
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
## 2013-07-02 NOTE — Progress Notes (Signed)
Patient ID: Curtis Mckinney, male   DOB: 1928/12/26, 79 y.o.   MRN: [id number] Curtis Mckinney is a very pleasant 79 year old patient who is blind,  with a history of paroxysmal atrial fib. He is followed by the [facility] clinic for medical issues and for PT/INR. He has had paroxysmal atrial fibrillation for 10 years, but now had appeared to be in permanent atrial fib on last visit.  He had a fall approximately 3 months ago when he tripped going down a stairway leading outside onto his back. He fractured his right femur head. He had surgery at the [facility] in [place] and spent the last 6 weeks at the [facility] for rehabilitation. He is currently walking with a cane. He has not had followup with his surgeon, but is due to see him next month. He complains of chronic lower extremity edema on the right. He had venous Doppler ultrasound completed on 04/29/2013, No evidence of right lower extremity deep venous thrombosis.  Seen in [facility] ER for palpitations 7/18  No changes in meds chronic afib noted  Mild headache INR Rx 2.8  Going to [place] to fish next week  ROS: Denies fever, malais, weight loss, blurry vision, decreased visual acuity, cough, sputum, SOB, hemoptysis, pleuritic pain, palpitaitons, heartburn, abdominal pain, melena, lower extremity edema, claudication, or rash.  All other systems reviewed and negative   General: Affect appropriate Healthy:  appears stated age HEENT: blind Neck supple with no adenopathy JVP normal no bruits no thyromegaly Lungs clear with no wheezing and good diaphragmatic motion Heart:  S1/S2 no murmur,rub, gallop or click PMI normal Abdomen: benighn, BS positve, no tenderness, no AAA no bruit.  No HSM or HJR Distal pulses intact with no bruits No edema Neuro non-focal Skin warm and dry No muscular weakness  Medications Current Outpatient Prescriptions  Medication Sig Dispense Refill  . Calcium Carbonate-Vitamin D (CALCIUM 600-D) 600-400 MG-UNIT per tablet  Take 1 tablet by mouth daily.        Marland Kitchen DOCUSATE SODIUM PO Take by mouth daily.      Marland Kitchen loratadine (CLARITIN) 10 MG tablet Take 10 mg by mouth daily.        . Multiple Vitamin (MULTIVITAMIN) tablet Take 1 tablet by mouth daily.        . Omeprazole (PRILOSEC PO) Take 20 mg by mouth as needed.       . verapamil (CALAN-SR) 240 MG CR tablet Take 120 mg by mouth daily.       Marland Kitchen warfarin (COUMADIN) 5 MG tablet Take 5 mg by mouth as directed.        No current facility-administered medications for this visit.    Allergies Diltiazem  Family History: Family History  Problem Relation Age of Onset  . Pneumonia Father   . Lung cancer Sister   . Leukemia Brother   . Heart attack Brother   . Cancer Brother   . Stroke Brother     Social History: History   Social History  . Marital Status: Married    Spouse Name: N/A    Number of Children: N/A  . Years of Education: N/A   Occupational History  . disabled    Social History Main Topics  . Smoking status: Former Smoker -- 1.00 packs/day for 20 years    Quit date: 11/28/1968  . Smokeless tobacco: Not on file  . Alcohol Use: No  . Drug Use: No  . Sexually Active: Not on file   Other Topics Concern  .  Not on file   Social History Narrative   Widowed   No regular exercise    Electrocardiogram:  afib rate 91 otherwise normal [place] ECG 06/14/13  Assessment and Plan

## 2013-07-15 ENCOUNTER — Ambulatory Visit: Payer: Medicare Other | Admitting: Adult Health

## 2013-07-15 ENCOUNTER — Ambulatory Visit (HOSPITAL_COMMUNITY)
Admission: RE | Admit: 2013-07-15 | Discharge: 2013-07-15 | Disposition: A | Discharge status: Home / Self Care | Payer: Medicare Other | Source: Ambulatory Visit | Attending: Cardiovascular Disease | Admitting: Cardiovascular Disease

## 2013-07-15 DIAGNOSIS — I4891 Unspecified atrial fibrillation: Secondary | ICD-10-CM | POA: Insufficient documentation

## 2013-07-15 NOTE — Progress Notes (Signed)
*  PRELIMINARY RESULTS* Echocardiogram 24H Holter monitor has been performed.  Conrad [place] 07/15/2013, 2:41 PM

## 2013-07-17 ENCOUNTER — Other Ambulatory Visit: Payer: Self-pay | Admitting: *Deleted

## 2013-07-17 DIAGNOSIS — I4891 Unspecified atrial fibrillation: Secondary | ICD-10-CM

## 2013-07-23 ENCOUNTER — Telehealth: Payer: Self-pay | Admitting: Adult Health

## 2013-07-23 NOTE — Telephone Encounter (Signed)
Please call patient regarding letter that he needs faxed to dentist regarding antibiotics/tgs

## 2013-07-24 NOTE — Telephone Encounter (Signed)
.  left message to have patient return my call.  

## 2013-07-25 NOTE — Telephone Encounter (Signed)
.  left message to have patient return my call.  

## 2013-07-30 NOTE — Telephone Encounter (Signed)
Pt notes he has been given a standing order to take antibiotic prior to dental work for the past 4-5 years per Dr Daleen Squibb, notes he takes Amoxicillin 500mg  capsules, 4 pills all at once 1 hour prior to the office visit, pt dentist did not see any reason the pt needs to continue this protocol and the dentist wants this sent in a hard copy to his office address instead of faxed, [facility] (Dr Charlann Lange)  [address] [place] [address], please advise if therapy can be discontinued

## 2013-08-05 ENCOUNTER — Telehealth: Payer: Self-pay | Admitting: Cardiology

## 2013-08-05 ENCOUNTER — Encounter: Payer: Self-pay | Admitting: Cardiology

## 2013-08-05 NOTE — Telephone Encounter (Signed)
Spoke w/ patient today 08/05/13 at 940AM regarding his question regarding antibiotics prior to dental procedure. Previously given a prn prescription, however dentist questioned if he truly needed abx prior to a procedure. Chart reviewed, pt w/ history of mitral valve prolapse. No history of prosthetic heart valve/prosthetic material in the heat, no history of congential heart disease. Explained based on current recommendations no indication for abx prior to dental procedure for him. Will provide needed letter to his dentist.

## 2013-08-15 DIAGNOSIS — I4891 Unspecified atrial fibrillation: Secondary | ICD-10-CM

## 2013-10-14 ENCOUNTER — Encounter: Payer: Medicare Other | Admitting: Cardiovascular Disease

## 2013-10-14 ENCOUNTER — Encounter: Payer: Self-pay | Admitting: *Deleted

## 2013-10-14 NOTE — Progress Notes (Signed)
Patient ID: Curtis Mckinney, male   DOB: May 31, 1929, 79 y.o.   MRN: [id number] Curtis Mckinney is a very pleasant 79 year old patient who is blind, with a history of paroxysmal atrial fib. He is followed by the [facility] clinic for medical issues and for PT/INR. He has had paroxysmal atrial fibrillation for 10 years, but now had appeared to be in permanent atrial fib on last visit.  He had a fall approximately 3 months ago when he tripped going down a stairway leading outside onto his back. He fractured his right femur head. He had surgery at the [facility] in [place] and spent the last 6 weeks at the [facility] for rehabilitation. He is currently walking with a cane. He has not had followup with his surgeon, but is due to see him next month. He complains of chronic lower extremity edema on the right. He had venous Doppler ultrasound completed on 04/29/2013, No evidence of right lower extremity deep venous thrombosis.  Seen in [facility] ER for palpitations 7/18 No changes in meds chronic afib noted Mild headache INR Rx 2.8 Going to [place] to fish next week  Holter in 8/14 showed average HR 88 with good rate control Echo 9/11 normal EF mild LAE   ROS: Denies fever, malais, weight loss, blurry vision, decreased visual acuity, cough, sputum, SOB, hemoptysis, pleuritic pain, palpitaitons, heartburn, abdominal pain, melena, lower extremity edema, claudication, or rash.  All other systems reviewed and negative  General: Affect appropriate Healthy:  appears stated age HEENT: normal Neck supple with no adenopathy JVP normal no bruits no thyromegaly Lungs clear with no wheezing and good diaphragmatic motion Heart:  S1/S2 no murmur, no rub, gallop or click PMI normal Abdomen: benighn, BS positve, no tenderness, no AAA no bruit.  No HSM or HJR Distal pulses intact with no bruits No edema Neuro non-focal Skin warm and dry No muscular weakness   Current Outpatient Prescriptions  Medication Sig Dispense  Refill  . Calcium Carbonate-Vitamin D (CALCIUM 600-D) 600-400 MG-UNIT per tablet Take 1 tablet by mouth daily.        Marland Kitchen DOCUSATE SODIUM PO Take by mouth daily.      Marland Kitchen loratadine (CLARITIN) 10 MG tablet Take 10 mg by mouth daily.        . Multiple Vitamin (MULTIVITAMIN) tablet Take 1 tablet by mouth daily.        . Omeprazole (PRILOSEC PO) Take 20 mg by mouth as needed.       . verapamil (CALAN-SR) 240 MG CR tablet Take 120 mg by mouth daily.       Marland Kitchen warfarin (COUMADIN) 5 MG tablet Take 5 mg by mouth as directed.        No current facility-administered medications for this visit.    Allergies  Diltiazem  Electrocardiogram:  6/24  afib rate 72 nonspecific ST/T wave changes  Assessment and Plan

## 2013-11-29 ENCOUNTER — Encounter: Payer: Self-pay | Admitting: *Deleted

## 2015-03-26 ENCOUNTER — Ambulatory Visit: Payer: Self-pay | Admitting: *Deleted

## 2015-03-26 DIAGNOSIS — I4891 Unspecified atrial fibrillation: Secondary | ICD-10-CM

## 2016-12-30 DIAGNOSIS — R002 Palpitations: Secondary | ICD-10-CM | POA: Diagnosis not present

## 2017-07-10 DIAGNOSIS — I482 Chronic atrial fibrillation: Secondary | ICD-10-CM | POA: Diagnosis not present

## 2017-07-10 DIAGNOSIS — Z6828 Body mass index (BMI) 28.0-28.9, adult: Secondary | ICD-10-CM | POA: Diagnosis not present

## 2017-07-10 DIAGNOSIS — L500 Allergic urticaria: Secondary | ICD-10-CM | POA: Diagnosis not present

## 2017-07-10 DIAGNOSIS — L80 Vitiligo: Secondary | ICD-10-CM | POA: Diagnosis not present

## 2017-09-02 DIAGNOSIS — M25552 Pain in left hip: Secondary | ICD-10-CM | POA: Diagnosis not present

## 2017-09-02 DIAGNOSIS — I6789 Other cerebrovascular disease: Secondary | ICD-10-CM | POA: Diagnosis not present

## 2017-09-02 DIAGNOSIS — R42 Dizziness and giddiness: Secondary | ICD-10-CM | POA: Diagnosis not present

## 2017-09-08 ENCOUNTER — Inpatient Hospital Stay
Admission: RE | Admit: 2017-09-08 | Discharge: 2017-09-28 | Disposition: A | Discharge status: Home / Self Care | Payer: Medicare Other | Source: Ambulatory Visit | Attending: Internal Medicine | Admitting: Internal Medicine

## 2017-09-08 DIAGNOSIS — Z4789 Encounter for other orthopedic aftercare: Secondary | ICD-10-CM | POA: Diagnosis not present

## 2017-09-08 DIAGNOSIS — N5089 Other specified disorders of the male genital organs: Secondary | ICD-10-CM | POA: Diagnosis not present

## 2017-09-08 DIAGNOSIS — S72002D Fracture of unspecified part of neck of left femur, subsequent encounter for closed fracture with routine healing: Secondary | ICD-10-CM | POA: Diagnosis not present

## 2017-09-08 DIAGNOSIS — I482 Chronic atrial fibrillation: Secondary | ICD-10-CM | POA: Diagnosis not present

## 2017-09-08 DIAGNOSIS — I480 Paroxysmal atrial fibrillation: Secondary | ICD-10-CM | POA: Diagnosis not present

## 2017-09-08 DIAGNOSIS — Z96642 Presence of left artificial hip joint: Secondary | ICD-10-CM | POA: Diagnosis not present

## 2017-09-08 DIAGNOSIS — M6281 Muscle weakness (generalized): Secondary | ICD-10-CM | POA: Diagnosis not present

## 2017-09-08 DIAGNOSIS — I10 Essential (primary) hypertension: Secondary | ICD-10-CM | POA: Diagnosis not present

## 2017-09-08 DIAGNOSIS — J302 Other seasonal allergic rhinitis: Secondary | ICD-10-CM | POA: Diagnosis not present

## 2017-09-08 DIAGNOSIS — K029 Dental caries, unspecified: Secondary | ICD-10-CM | POA: Diagnosis not present

## 2017-09-08 DIAGNOSIS — M8000XS Age-related osteoporosis with current pathological fracture, unspecified site, sequela: Secondary | ICD-10-CM | POA: Diagnosis not present

## 2017-09-08 DIAGNOSIS — R262 Difficulty in walking, not elsewhere classified: Secondary | ICD-10-CM | POA: Diagnosis not present

## 2017-09-08 DIAGNOSIS — I4891 Unspecified atrial fibrillation: Secondary | ICD-10-CM | POA: Diagnosis not present

## 2017-09-08 DIAGNOSIS — I951 Orthostatic hypotension: Secondary | ICD-10-CM | POA: Diagnosis not present

## 2017-09-08 DIAGNOSIS — Z9181 History of falling: Secondary | ICD-10-CM | POA: Diagnosis not present

## 2017-09-08 DIAGNOSIS — D696 Thrombocytopenia, unspecified: Secondary | ICD-10-CM | POA: Diagnosis not present

## 2017-09-08 DIAGNOSIS — Z7901 Long term (current) use of anticoagulants: Secondary | ICD-10-CM | POA: Diagnosis not present

## 2017-09-09 ENCOUNTER — Encounter (HOSPITAL_COMMUNITY)
Admission: RE | Admit: 2017-09-09 | Discharge: 2017-09-09 | Disposition: A | Discharge status: Home / Self Care | Payer: Medicare Other | Source: Skilled Nursing Facility | Attending: Internal Medicine | Admitting: Internal Medicine

## 2017-09-09 DIAGNOSIS — I4891 Unspecified atrial fibrillation: Secondary | ICD-10-CM | POA: Insufficient documentation

## 2017-09-09 LAB — CBC WITH DIFFERENTIAL/PLATELET
Basophils Absolute: 0 10*3/uL (ref 0.0–0.1)
Basophils Relative: 0 %
Eosinophils Absolute: 0.2 10*3/uL (ref 0.0–0.7)
Eosinophils Relative: 2 %
HEMATOCRIT: 37.1 % — AB (ref 39.0–52.0)
Hemoglobin: 12.1 g/dL — ABNORMAL LOW (ref 13.0–17.0)
LYMPHS ABS: 1.4 10*3/uL (ref 0.7–4.0)
LYMPHS PCT: 18 %
MCH: 32.5 pg (ref 26.0–34.0)
MCHC: 32.6 g/dL (ref 30.0–36.0)
MCV: 99.7 fL (ref 78.0–100.0)
MONO ABS: 0.5 10*3/uL (ref 0.1–1.0)
Monocytes Relative: 6 %
NEUTROS ABS: 5.7 10*3/uL (ref 1.7–7.7)
Neutrophils Relative %: 73 %
Platelets: 109 10*3/uL — ABNORMAL LOW (ref 150–400)
RBC: 3.72 MIL/uL — ABNORMAL LOW (ref 4.22–5.81)
RDW: 13.1 % (ref 11.5–15.5)
WBC: 7.8 10*3/uL (ref 4.0–10.5)

## 2017-09-09 LAB — PROTIME-INR
INR: 1.47
Prothrombin Time: 17.7 seconds — ABNORMAL HIGH (ref 11.4–15.2)

## 2017-09-09 LAB — BASIC METABOLIC PANEL
ANION GAP: 10 (ref 5–15)
BUN: 18 mg/dL (ref 6–20)
CALCIUM: 8.3 mg/dL — AB (ref 8.9–10.3)
CHLORIDE: 101 mmol/L (ref 101–111)
CO2: 27 mmol/L (ref 22–32)
Creatinine, Ser: 0.81 mg/dL (ref 0.61–1.24)
GFR calc Af Amer: >60 mL/min (ref >60)
GFR calc non Af Amer: >60 mL/min (ref >60)
Glucose, Bld: 122 mg/dL — ABNORMAL HIGH (ref 65–99)
Potassium: 3.9 mmol/L (ref 3.5–5.1)
Sodium: 138 mmol/L (ref 135–145)

## 2017-09-10 ENCOUNTER — Encounter (HOSPITAL_COMMUNITY)
Admission: RE | Admit: 2017-09-10 | Discharge: 2017-09-10 | Disposition: A | Discharge status: Home / Self Care | Payer: Medicare Other | Source: Skilled Nursing Facility | Attending: *Deleted | Admitting: *Deleted

## 2017-09-10 LAB — PROTIME-INR
INR: 1.82
PROTHROMBIN TIME: 20.9 s — AB (ref 11.4–15.2)

## 2017-09-11 ENCOUNTER — Non-Acute Institutional Stay (SKILLED_NURSING_FACILITY): Payer: Medicare Other | Admitting: Internal Medicine

## 2017-09-11 ENCOUNTER — Other Ambulatory Visit (HOSPITAL_COMMUNITY)
Admission: RE | Admit: 2017-09-11 | Discharge: 2017-09-11 | Disposition: A | Discharge status: Home / Self Care | Payer: Medicare Other | Source: Skilled Nursing Facility | Attending: Internal Medicine | Admitting: Internal Medicine

## 2017-09-11 ENCOUNTER — Encounter: Payer: Self-pay | Admitting: Internal Medicine

## 2017-09-11 DIAGNOSIS — D696 Thrombocytopenia, unspecified: Secondary | ICD-10-CM | POA: Insufficient documentation

## 2017-09-11 DIAGNOSIS — I480 Paroxysmal atrial fibrillation: Secondary | ICD-10-CM | POA: Diagnosis not present

## 2017-09-11 DIAGNOSIS — Z96642 Presence of left artificial hip joint: Secondary | ICD-10-CM | POA: Diagnosis not present

## 2017-09-11 DIAGNOSIS — M8000XS Age-related osteoporosis with current pathological fracture, unspecified site, sequela: Secondary | ICD-10-CM

## 2017-09-11 DIAGNOSIS — Z7901 Long term (current) use of anticoagulants: Secondary | ICD-10-CM | POA: Insufficient documentation

## 2017-09-11 DIAGNOSIS — M810 Age-related osteoporosis without current pathological fracture: Secondary | ICD-10-CM | POA: Insufficient documentation

## 2017-09-11 LAB — PROTIME-INR
INR: 1.98
PROTHROMBIN TIME: 22.3 s — AB (ref 11.4–15.2)
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
## 2017-09-12 ENCOUNTER — Encounter (HOSPITAL_COMMUNITY)
Admission: RE | Admit: 2017-09-12 | Discharge: 2017-09-12 | Disposition: A | Discharge status: Home / Self Care | Payer: Medicare Other | Source: Skilled Nursing Facility | Attending: *Deleted | Admitting: *Deleted

## 2017-09-12 ENCOUNTER — Non-Acute Institutional Stay (SKILLED_NURSING_FACILITY): Payer: Medicare Other | Admitting: Internal Medicine

## 2017-09-12 ENCOUNTER — Encounter: Payer: Self-pay | Admitting: Internal Medicine

## 2017-09-12 DIAGNOSIS — I951 Orthostatic hypotension: Secondary | ICD-10-CM | POA: Diagnosis not present

## 2017-09-12 DIAGNOSIS — I480 Paroxysmal atrial fibrillation: Secondary | ICD-10-CM

## 2017-09-12 DIAGNOSIS — Z7901 Long term (current) use of anticoagulants: Secondary | ICD-10-CM | POA: Diagnosis not present

## 2017-09-12 DIAGNOSIS — Z96642 Presence of left artificial hip joint: Secondary | ICD-10-CM

## 2017-09-12 LAB — PROTIME-INR
INR: 2.17
Prothrombin Time: 24 seconds — ABNORMAL HIGH (ref 11.4–15.2)
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
## 2017-09-13 ENCOUNTER — Other Ambulatory Visit: Payer: Self-pay

## 2017-09-13 MED ORDER — OXYCODONE HCL 5 MG PO CAPS: 5.0000 mg | ORAL_CAPSULE | Freq: Four times a day (QID) | ORAL | 0 refills | Status: DC | PRN

## 2017-09-13 NOTE — Telephone Encounter (Signed)
RX Fax for [facility]@ [phone]  

## 2017-09-14 ENCOUNTER — Encounter (HOSPITAL_COMMUNITY)
Admission: RE | Admit: 2017-09-14 | Discharge: 2017-09-14 | Disposition: A | Discharge status: Home / Self Care | Payer: Medicare Other | Source: Skilled Nursing Facility | Attending: Internal Medicine | Admitting: Internal Medicine

## 2017-09-14 DIAGNOSIS — Z4789 Encounter for other orthopedic aftercare: Secondary | ICD-10-CM | POA: Insufficient documentation

## 2017-09-14 DIAGNOSIS — I482 Chronic atrial fibrillation: Secondary | ICD-10-CM | POA: Insufficient documentation

## 2017-09-14 DIAGNOSIS — S72002D Fracture of unspecified part of neck of left femur, subsequent encounter for closed fracture with routine healing: Secondary | ICD-10-CM | POA: Insufficient documentation

## 2017-09-14 DIAGNOSIS — Z7901 Long term (current) use of anticoagulants: Secondary | ICD-10-CM | POA: Insufficient documentation

## 2017-09-14 LAB — CBC
HCT: 39.5 % (ref 39.0–52.0)
Hemoglobin: 12.6 g/dL — ABNORMAL LOW (ref 13.0–17.0)
MCH: 32.4 pg (ref 26.0–34.0)
MCHC: 31.9 g/dL (ref 30.0–36.0)
MCV: 101.5 fL — AB (ref 78.0–100.0)
PLATELETS: 293 10*3/uL (ref 150–400)
RBC: 3.89 MIL/uL — ABNORMAL LOW (ref 4.22–5.81)
RDW: 13.3 % (ref 11.5–15.5)
WBC: 7.1 10*3/uL (ref 4.0–10.5)

## 2017-09-14 LAB — COMPREHENSIVE METABOLIC PANEL
ALT: 27 U/L (ref 17–63)
ANION GAP: 8 (ref 5–15)
AST: 27 U/L (ref 15–41)
Albumin: 3.3 g/dL — ABNORMAL LOW (ref 3.5–5.0)
Alkaline Phosphatase: 60 U/L (ref 38–126)
BUN: 18 mg/dL (ref 6–20)
CALCIUM: 8.5 mg/dL — AB (ref 8.9–10.3)
CHLORIDE: 102 mmol/L (ref 101–111)
CO2: 28 mmol/L (ref 22–32)
CREATININE: 0.97 mg/dL (ref 0.61–1.24)
Glucose, Bld: 109 mg/dL — ABNORMAL HIGH (ref 65–99)
Potassium: 4.3 mmol/L (ref 3.5–5.1)
SODIUM: 138 mmol/L (ref 135–145)
Total Bilirubin: 1 mg/dL (ref 0.3–1.2)
Total Protein: 7 g/dL (ref 6.5–8.1)

## 2017-09-14 LAB — PROTIME-INR
INR: 2.91
PROTHROMBIN TIME: 30.2 s — AB (ref 11.4–15.2)

## 2017-09-18 ENCOUNTER — Encounter (HOSPITAL_COMMUNITY)
Admission: RE | Admit: 2017-09-18 | Discharge: 2017-09-18 | Disposition: A | Discharge status: Home / Self Care | Payer: Medicare Other | Source: Skilled Nursing Facility | Attending: *Deleted | Admitting: *Deleted

## 2017-09-18 LAB — PROTIME-INR
INR: 2.92
Prothrombin Time: 30.3 seconds — ABNORMAL HIGH (ref 11.4–15.2)

## 2017-09-21 ENCOUNTER — Encounter (HOSPITAL_COMMUNITY)
Admission: RE | Admit: 2017-09-21 | Discharge: 2017-09-21 | Disposition: A | Discharge status: Home / Self Care | Payer: Medicare Other | Source: Skilled Nursing Facility | Attending: Internal Medicine | Admitting: Internal Medicine

## 2017-09-21 DIAGNOSIS — Z4789 Encounter for other orthopedic aftercare: Secondary | ICD-10-CM | POA: Insufficient documentation

## 2017-09-21 DIAGNOSIS — S72002D Fracture of unspecified part of neck of left femur, subsequent encounter for closed fracture with routine healing: Secondary | ICD-10-CM | POA: Insufficient documentation

## 2017-09-21 DIAGNOSIS — I482 Chronic atrial fibrillation: Secondary | ICD-10-CM | POA: Insufficient documentation

## 2017-09-21 DIAGNOSIS — Z7901 Long term (current) use of anticoagulants: Secondary | ICD-10-CM | POA: Insufficient documentation

## 2017-09-21 LAB — PROTIME-INR
INR: 2.62
Prothrombin Time: 27.8 seconds — ABNORMAL HIGH (ref 11.4–15.2)

## 2017-09-24 ENCOUNTER — Encounter (HOSPITAL_COMMUNITY)
Admission: AD | Admit: 2017-09-24 | Discharge: 2017-09-24 | Disposition: A | Discharge status: Home / Self Care | Payer: Medicare Other | Source: Skilled Nursing Facility | Attending: *Deleted | Admitting: *Deleted

## 2017-09-25 ENCOUNTER — Encounter (HOSPITAL_COMMUNITY): Admission: RE | Admit: 2017-09-25 | Payer: Medicare Other | Source: Skilled Nursing Facility | Admitting: *Deleted

## 2017-09-25 LAB — BASIC METABOLIC PANEL
Anion gap: 6 (ref 5–15)
BUN: 23 mg/dL — AB (ref 6–20)
CHLORIDE: 103 mmol/L (ref 101–111)
CO2: 26 mmol/L (ref 22–32)
CREATININE: 0.98 mg/dL (ref 0.61–1.24)
Calcium: 8.3 mg/dL — ABNORMAL LOW (ref 8.9–10.3)
GFR calc Af Amer: >60 mL/min (ref >60)
GFR calc non Af Amer: >60 mL/min (ref >60)
Glucose, Bld: 104 mg/dL — ABNORMAL HIGH (ref 65–99)
Potassium: 4.2 mmol/L (ref 3.5–5.1)
SODIUM: 135 mmol/L (ref 135–145)

## 2017-09-25 LAB — CBC
HEMATOCRIT: 36.1 % — AB (ref 39.0–52.0)
Hemoglobin: 11.6 g/dL — ABNORMAL LOW (ref 13.0–17.0)
MCH: 32.5 pg (ref 26.0–34.0)
MCHC: 32.1 g/dL (ref 30.0–36.0)
MCV: 101.1 fL — ABNORMAL HIGH (ref 78.0–100.0)
Platelets: 242 10*3/uL (ref 150–400)
RBC: 3.57 MIL/uL — ABNORMAL LOW (ref 4.22–5.81)
RDW: 13 % (ref 11.5–15.5)
WBC: 7.8 10*3/uL (ref 4.0–10.5)

## 2017-09-27 ENCOUNTER — Non-Acute Institutional Stay (SKILLED_NURSING_FACILITY): Payer: Medicare Other | Admitting: Internal Medicine

## 2017-09-27 ENCOUNTER — Encounter: Payer: Self-pay | Admitting: Internal Medicine

## 2017-09-27 DIAGNOSIS — D696 Thrombocytopenia, unspecified: Secondary | ICD-10-CM | POA: Diagnosis not present

## 2017-09-27 DIAGNOSIS — I480 Paroxysmal atrial fibrillation: Secondary | ICD-10-CM | POA: Diagnosis not present

## 2017-09-27 DIAGNOSIS — Z7901 Long term (current) use of anticoagulants: Secondary | ICD-10-CM | POA: Diagnosis not present

## 2017-09-27 DIAGNOSIS — Z96642 Presence of left artificial hip joint: Secondary | ICD-10-CM

## 2017-09-27 DIAGNOSIS — M8000XS Age-related osteoporosis with current pathological fracture, unspecified site, sequela: Secondary | ICD-10-CM | POA: Diagnosis not present
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
## 2017-09-28 ENCOUNTER — Encounter (HOSPITAL_COMMUNITY)
Admission: RE | Admit: 2017-09-28 | Discharge: 2017-09-28 | Disposition: A | Discharge status: Home / Self Care | Payer: Medicare Other | Source: Skilled Nursing Facility | Attending: Internal Medicine | Admitting: Internal Medicine

## 2017-09-28 DIAGNOSIS — I4891 Unspecified atrial fibrillation: Secondary | ICD-10-CM | POA: Diagnosis not present

## 2017-09-28 LAB — PROTIME-INR
INR: 1.7
Prothrombin Time: 19.8 seconds — ABNORMAL HIGH (ref 11.4–15.2)

## 2017-09-29 DIAGNOSIS — I349 Nonrheumatic mitral valve disorder, unspecified: Secondary | ICD-10-CM | POA: Diagnosis not present

## 2017-09-29 DIAGNOSIS — Z7901 Long term (current) use of anticoagulants: Secondary | ICD-10-CM | POA: Diagnosis not present

## 2017-09-29 DIAGNOSIS — Y92009 Unspecified place in unspecified non-institutional (private) residence as the place of occurrence of the external cause: Secondary | ICD-10-CM | POA: Diagnosis not present

## 2017-09-29 DIAGNOSIS — Z96642 Presence of left artificial hip joint: Secondary | ICD-10-CM | POA: Diagnosis not present

## 2017-09-29 DIAGNOSIS — W19XXXD Unspecified fall, subsequent encounter: Secondary | ICD-10-CM | POA: Diagnosis not present

## 2017-09-29 DIAGNOSIS — H548 Legal blindness, as defined in USA: Secondary | ICD-10-CM | POA: Diagnosis not present

## 2017-09-29 DIAGNOSIS — D696 Thrombocytopenia, unspecified: Secondary | ICD-10-CM | POA: Diagnosis not present

## 2017-09-29 DIAGNOSIS — Z9181 History of falling: Secondary | ICD-10-CM | POA: Diagnosis not present

## 2017-09-29 DIAGNOSIS — I480 Paroxysmal atrial fibrillation: Secondary | ICD-10-CM | POA: Diagnosis not present

## 2017-09-29 DIAGNOSIS — M80052D Age-related osteoporosis with current pathological fracture, left femur, subsequent encounter for fracture with routine healing: Secondary | ICD-10-CM | POA: Diagnosis not present

## 2017-09-29 DIAGNOSIS — Z87891 Personal history of nicotine dependence: Secondary | ICD-10-CM | POA: Diagnosis not present

## 2017-09-29 DIAGNOSIS — Z85820 Personal history of malignant melanoma of skin: Secondary | ICD-10-CM | POA: Diagnosis not present

## 2017-09-29 DIAGNOSIS — Z8781 Personal history of (healed) traumatic fracture: Secondary | ICD-10-CM | POA: Diagnosis not present

## 2017-10-03 DIAGNOSIS — I349 Nonrheumatic mitral valve disorder, unspecified: Secondary | ICD-10-CM | POA: Diagnosis not present

## 2017-10-03 DIAGNOSIS — M80052D Age-related osteoporosis with current pathological fracture, left femur, subsequent encounter for fracture with routine healing: Secondary | ICD-10-CM | POA: Diagnosis not present

## 2017-10-03 DIAGNOSIS — D696 Thrombocytopenia, unspecified: Secondary | ICD-10-CM | POA: Diagnosis not present

## 2017-10-03 DIAGNOSIS — I480 Paroxysmal atrial fibrillation: Secondary | ICD-10-CM | POA: Diagnosis not present

## 2017-10-03 DIAGNOSIS — Z96642 Presence of left artificial hip joint: Secondary | ICD-10-CM | POA: Diagnosis not present

## 2017-10-03 DIAGNOSIS — H548 Legal blindness, as defined in USA: Secondary | ICD-10-CM | POA: Diagnosis not present

## 2017-10-04 DIAGNOSIS — I349 Nonrheumatic mitral valve disorder, unspecified: Secondary | ICD-10-CM | POA: Diagnosis not present

## 2017-10-04 DIAGNOSIS — D696 Thrombocytopenia, unspecified: Secondary | ICD-10-CM | POA: Diagnosis not present

## 2017-10-04 DIAGNOSIS — M80052D Age-related osteoporosis with current pathological fracture, left femur, subsequent encounter for fracture with routine healing: Secondary | ICD-10-CM | POA: Diagnosis not present

## 2017-10-04 DIAGNOSIS — Z96642 Presence of left artificial hip joint: Secondary | ICD-10-CM | POA: Diagnosis not present

## 2017-10-04 DIAGNOSIS — I480 Paroxysmal atrial fibrillation: Secondary | ICD-10-CM | POA: Diagnosis not present

## 2017-10-04 DIAGNOSIS — H548 Legal blindness, as defined in USA: Secondary | ICD-10-CM | POA: Diagnosis not present

## 2017-10-05 DIAGNOSIS — M80052D Age-related osteoporosis with current pathological fracture, left femur, subsequent encounter for fracture with routine healing: Secondary | ICD-10-CM | POA: Diagnosis not present

## 2017-10-05 DIAGNOSIS — H548 Legal blindness, as defined in USA: Secondary | ICD-10-CM | POA: Diagnosis not present

## 2017-10-05 DIAGNOSIS — I349 Nonrheumatic mitral valve disorder, unspecified: Secondary | ICD-10-CM | POA: Diagnosis not present

## 2017-10-05 DIAGNOSIS — I480 Paroxysmal atrial fibrillation: Secondary | ICD-10-CM | POA: Diagnosis not present

## 2017-10-05 DIAGNOSIS — D696 Thrombocytopenia, unspecified: Secondary | ICD-10-CM | POA: Diagnosis not present

## 2017-10-05 DIAGNOSIS — Z96642 Presence of left artificial hip joint: Secondary | ICD-10-CM | POA: Diagnosis not present

## 2017-10-06 DIAGNOSIS — H548 Legal blindness, as defined in USA: Secondary | ICD-10-CM | POA: Diagnosis not present

## 2017-10-06 DIAGNOSIS — I480 Paroxysmal atrial fibrillation: Secondary | ICD-10-CM | POA: Diagnosis not present

## 2017-10-06 DIAGNOSIS — Z96642 Presence of left artificial hip joint: Secondary | ICD-10-CM | POA: Diagnosis not present

## 2017-10-06 DIAGNOSIS — M80052D Age-related osteoporosis with current pathological fracture, left femur, subsequent encounter for fracture with routine healing: Secondary | ICD-10-CM | POA: Diagnosis not present

## 2017-10-06 DIAGNOSIS — I349 Nonrheumatic mitral valve disorder, unspecified: Secondary | ICD-10-CM | POA: Diagnosis not present

## 2017-10-06 DIAGNOSIS — D696 Thrombocytopenia, unspecified: Secondary | ICD-10-CM | POA: Diagnosis not present

## 2017-10-09 DIAGNOSIS — I480 Paroxysmal atrial fibrillation: Secondary | ICD-10-CM | POA: Diagnosis not present

## 2017-10-09 DIAGNOSIS — M80052D Age-related osteoporosis with current pathological fracture, left femur, subsequent encounter for fracture with routine healing: Secondary | ICD-10-CM | POA: Diagnosis not present

## 2017-10-09 DIAGNOSIS — Z96642 Presence of left artificial hip joint: Secondary | ICD-10-CM | POA: Diagnosis not present

## 2017-10-09 DIAGNOSIS — D696 Thrombocytopenia, unspecified: Secondary | ICD-10-CM | POA: Diagnosis not present

## 2017-10-09 DIAGNOSIS — I349 Nonrheumatic mitral valve disorder, unspecified: Secondary | ICD-10-CM | POA: Diagnosis not present

## 2017-10-09 DIAGNOSIS — H548 Legal blindness, as defined in USA: Secondary | ICD-10-CM | POA: Diagnosis not present

## 2017-10-10 DIAGNOSIS — M80052D Age-related osteoporosis with current pathological fracture, left femur, subsequent encounter for fracture with routine healing: Secondary | ICD-10-CM | POA: Diagnosis not present

## 2017-10-10 DIAGNOSIS — I349 Nonrheumatic mitral valve disorder, unspecified: Secondary | ICD-10-CM | POA: Diagnosis not present

## 2017-10-10 DIAGNOSIS — Z96642 Presence of left artificial hip joint: Secondary | ICD-10-CM | POA: Diagnosis not present

## 2017-10-10 DIAGNOSIS — I480 Paroxysmal atrial fibrillation: Secondary | ICD-10-CM | POA: Diagnosis not present

## 2017-10-10 DIAGNOSIS — D696 Thrombocytopenia, unspecified: Secondary | ICD-10-CM | POA: Diagnosis not present

## 2017-10-10 DIAGNOSIS — H548 Legal blindness, as defined in USA: Secondary | ICD-10-CM | POA: Diagnosis not present

## 2017-10-11 DIAGNOSIS — M80052D Age-related osteoporosis with current pathological fracture, left femur, subsequent encounter for fracture with routine healing: Secondary | ICD-10-CM | POA: Diagnosis not present

## 2017-10-11 DIAGNOSIS — D696 Thrombocytopenia, unspecified: Secondary | ICD-10-CM | POA: Diagnosis not present

## 2017-10-11 DIAGNOSIS — Z96642 Presence of left artificial hip joint: Secondary | ICD-10-CM | POA: Diagnosis not present

## 2017-10-11 DIAGNOSIS — H548 Legal blindness, as defined in USA: Secondary | ICD-10-CM | POA: Diagnosis not present

## 2017-10-11 DIAGNOSIS — I480 Paroxysmal atrial fibrillation: Secondary | ICD-10-CM | POA: Diagnosis not present

## 2017-10-11 DIAGNOSIS — I349 Nonrheumatic mitral valve disorder, unspecified: Secondary | ICD-10-CM | POA: Diagnosis not present

## 2017-10-12 DIAGNOSIS — Z96642 Presence of left artificial hip joint: Secondary | ICD-10-CM | POA: Diagnosis not present

## 2017-10-12 DIAGNOSIS — D696 Thrombocytopenia, unspecified: Secondary | ICD-10-CM | POA: Diagnosis not present

## 2017-10-12 DIAGNOSIS — M80052D Age-related osteoporosis with current pathological fracture, left femur, subsequent encounter for fracture with routine healing: Secondary | ICD-10-CM | POA: Diagnosis not present

## 2017-10-12 DIAGNOSIS — I349 Nonrheumatic mitral valve disorder, unspecified: Secondary | ICD-10-CM | POA: Diagnosis not present

## 2017-10-12 DIAGNOSIS — I480 Paroxysmal atrial fibrillation: Secondary | ICD-10-CM | POA: Diagnosis not present

## 2017-10-12 DIAGNOSIS — H548 Legal blindness, as defined in USA: Secondary | ICD-10-CM | POA: Diagnosis not present

## 2017-10-13 DIAGNOSIS — I349 Nonrheumatic mitral valve disorder, unspecified: Secondary | ICD-10-CM | POA: Diagnosis not present

## 2017-10-13 DIAGNOSIS — H548 Legal blindness, as defined in USA: Secondary | ICD-10-CM | POA: Diagnosis not present

## 2017-10-13 DIAGNOSIS — M80052D Age-related osteoporosis with current pathological fracture, left femur, subsequent encounter for fracture with routine healing: Secondary | ICD-10-CM | POA: Diagnosis not present

## 2017-10-13 DIAGNOSIS — Z96642 Presence of left artificial hip joint: Secondary | ICD-10-CM | POA: Diagnosis not present

## 2017-10-13 DIAGNOSIS — I480 Paroxysmal atrial fibrillation: Secondary | ICD-10-CM | POA: Diagnosis not present

## 2017-10-13 DIAGNOSIS — D696 Thrombocytopenia, unspecified: Secondary | ICD-10-CM | POA: Diagnosis not present

## 2017-10-16 DIAGNOSIS — D696 Thrombocytopenia, unspecified: Secondary | ICD-10-CM | POA: Diagnosis not present

## 2017-10-16 DIAGNOSIS — I480 Paroxysmal atrial fibrillation: Secondary | ICD-10-CM | POA: Diagnosis not present

## 2017-10-16 DIAGNOSIS — M80052D Age-related osteoporosis with current pathological fracture, left femur, subsequent encounter for fracture with routine healing: Secondary | ICD-10-CM | POA: Diagnosis not present

## 2017-10-16 DIAGNOSIS — I349 Nonrheumatic mitral valve disorder, unspecified: Secondary | ICD-10-CM | POA: Diagnosis not present

## 2017-10-16 DIAGNOSIS — Z96642 Presence of left artificial hip joint: Secondary | ICD-10-CM | POA: Diagnosis not present

## 2017-10-16 DIAGNOSIS — H548 Legal blindness, as defined in USA: Secondary | ICD-10-CM | POA: Diagnosis not present

## 2017-10-17 DIAGNOSIS — H548 Legal blindness, as defined in USA: Secondary | ICD-10-CM | POA: Diagnosis not present

## 2017-10-17 DIAGNOSIS — Z96642 Presence of left artificial hip joint: Secondary | ICD-10-CM | POA: Diagnosis not present

## 2017-10-17 DIAGNOSIS — M80052D Age-related osteoporosis with current pathological fracture, left femur, subsequent encounter for fracture with routine healing: Secondary | ICD-10-CM | POA: Diagnosis not present

## 2017-10-17 DIAGNOSIS — D696 Thrombocytopenia, unspecified: Secondary | ICD-10-CM | POA: Diagnosis not present

## 2017-10-17 DIAGNOSIS — I349 Nonrheumatic mitral valve disorder, unspecified: Secondary | ICD-10-CM | POA: Diagnosis not present

## 2017-10-17 DIAGNOSIS — I480 Paroxysmal atrial fibrillation: Secondary | ICD-10-CM | POA: Diagnosis not present

## 2017-10-18 DIAGNOSIS — H548 Legal blindness, as defined in USA: Secondary | ICD-10-CM | POA: Diagnosis not present

## 2017-10-18 DIAGNOSIS — I349 Nonrheumatic mitral valve disorder, unspecified: Secondary | ICD-10-CM | POA: Diagnosis not present

## 2017-10-18 DIAGNOSIS — Z96642 Presence of left artificial hip joint: Secondary | ICD-10-CM | POA: Diagnosis not present

## 2017-10-18 DIAGNOSIS — M80052D Age-related osteoporosis with current pathological fracture, left femur, subsequent encounter for fracture with routine healing: Secondary | ICD-10-CM | POA: Diagnosis not present

## 2017-10-18 DIAGNOSIS — I480 Paroxysmal atrial fibrillation: Secondary | ICD-10-CM | POA: Diagnosis not present

## 2017-10-18 DIAGNOSIS — D696 Thrombocytopenia, unspecified: Secondary | ICD-10-CM | POA: Diagnosis not present

## 2017-10-20 DIAGNOSIS — H548 Legal blindness, as defined in USA: Secondary | ICD-10-CM | POA: Diagnosis not present

## 2017-10-20 DIAGNOSIS — I480 Paroxysmal atrial fibrillation: Secondary | ICD-10-CM | POA: Diagnosis not present

## 2017-10-20 DIAGNOSIS — Z96642 Presence of left artificial hip joint: Secondary | ICD-10-CM | POA: Diagnosis not present

## 2017-10-20 DIAGNOSIS — D696 Thrombocytopenia, unspecified: Secondary | ICD-10-CM | POA: Diagnosis not present

## 2017-10-20 DIAGNOSIS — M80052D Age-related osteoporosis with current pathological fracture, left femur, subsequent encounter for fracture with routine healing: Secondary | ICD-10-CM | POA: Diagnosis not present

## 2017-10-20 DIAGNOSIS — I349 Nonrheumatic mitral valve disorder, unspecified: Secondary | ICD-10-CM | POA: Diagnosis not present

## 2017-10-23 DIAGNOSIS — I349 Nonrheumatic mitral valve disorder, unspecified: Secondary | ICD-10-CM | POA: Diagnosis not present

## 2017-10-23 DIAGNOSIS — Z96642 Presence of left artificial hip joint: Secondary | ICD-10-CM | POA: Diagnosis not present

## 2017-10-23 DIAGNOSIS — D696 Thrombocytopenia, unspecified: Secondary | ICD-10-CM | POA: Diagnosis not present

## 2017-10-23 DIAGNOSIS — M80052D Age-related osteoporosis with current pathological fracture, left femur, subsequent encounter for fracture with routine healing: Secondary | ICD-10-CM | POA: Diagnosis not present

## 2017-10-23 DIAGNOSIS — H548 Legal blindness, as defined in USA: Secondary | ICD-10-CM | POA: Diagnosis not present

## 2017-10-23 DIAGNOSIS — I480 Paroxysmal atrial fibrillation: Secondary | ICD-10-CM | POA: Diagnosis not present

## 2017-10-25 DIAGNOSIS — M80052D Age-related osteoporosis with current pathological fracture, left femur, subsequent encounter for fracture with routine healing: Secondary | ICD-10-CM | POA: Diagnosis not present

## 2017-10-25 DIAGNOSIS — I480 Paroxysmal atrial fibrillation: Secondary | ICD-10-CM | POA: Diagnosis not present

## 2017-10-25 DIAGNOSIS — I349 Nonrheumatic mitral valve disorder, unspecified: Secondary | ICD-10-CM | POA: Diagnosis not present

## 2017-10-25 DIAGNOSIS — D696 Thrombocytopenia, unspecified: Secondary | ICD-10-CM | POA: Diagnosis not present

## 2017-10-25 DIAGNOSIS — H548 Legal blindness, as defined in USA: Secondary | ICD-10-CM | POA: Diagnosis not present

## 2017-10-25 DIAGNOSIS — Z96642 Presence of left artificial hip joint: Secondary | ICD-10-CM | POA: Diagnosis not present

## 2017-10-27 DIAGNOSIS — M80052D Age-related osteoporosis with current pathological fracture, left femur, subsequent encounter for fracture with routine healing: Secondary | ICD-10-CM | POA: Diagnosis not present

## 2017-10-27 DIAGNOSIS — D696 Thrombocytopenia, unspecified: Secondary | ICD-10-CM | POA: Diagnosis not present

## 2017-10-27 DIAGNOSIS — Z96642 Presence of left artificial hip joint: Secondary | ICD-10-CM | POA: Diagnosis not present

## 2017-10-27 DIAGNOSIS — I480 Paroxysmal atrial fibrillation: Secondary | ICD-10-CM | POA: Diagnosis not present

## 2017-10-27 DIAGNOSIS — I349 Nonrheumatic mitral valve disorder, unspecified: Secondary | ICD-10-CM | POA: Diagnosis not present

## 2017-10-27 DIAGNOSIS — H548 Legal blindness, as defined in USA: Secondary | ICD-10-CM | POA: Diagnosis not present

## 2018-01-31 DIAGNOSIS — R55 Syncope and collapse: Secondary | ICD-10-CM | POA: Diagnosis not present

## 2018-01-31 DIAGNOSIS — I482 Chronic atrial fibrillation: Secondary | ICD-10-CM | POA: Diagnosis not present

## 2018-01-31 DIAGNOSIS — R0602 Shortness of breath: Secondary | ICD-10-CM | POA: Diagnosis not present

## 2018-01-31 DIAGNOSIS — I951 Orthostatic hypotension: Secondary | ICD-10-CM | POA: Diagnosis not present

## 2018-01-31 DIAGNOSIS — Z6826 Body mass index (BMI) 26.0-26.9, adult: Secondary | ICD-10-CM | POA: Diagnosis not present

## 2018-02-02 DIAGNOSIS — Z87891 Personal history of nicotine dependence: Secondary | ICD-10-CM | POA: Diagnosis not present

## 2018-02-02 DIAGNOSIS — Z6826 Body mass index (BMI) 26.0-26.9, adult: Secondary | ICD-10-CM | POA: Diagnosis not present

## 2018-02-02 DIAGNOSIS — Z7901 Long term (current) use of anticoagulants: Secondary | ICD-10-CM | POA: Diagnosis not present

## 2018-02-02 DIAGNOSIS — I482 Chronic atrial fibrillation: Secondary | ICD-10-CM | POA: Diagnosis not present

## 2018-02-02 DIAGNOSIS — I951 Orthostatic hypotension: Secondary | ICD-10-CM | POA: Diagnosis not present

## 2018-02-07 DIAGNOSIS — Z6826 Body mass index (BMI) 26.0-26.9, adult: Secondary | ICD-10-CM | POA: Diagnosis not present

## 2018-02-07 DIAGNOSIS — Z7901 Long term (current) use of anticoagulants: Secondary | ICD-10-CM | POA: Diagnosis not present

## 2018-02-07 DIAGNOSIS — Z87891 Personal history of nicotine dependence: Secondary | ICD-10-CM | POA: Diagnosis not present

## 2018-02-07 DIAGNOSIS — I482 Chronic atrial fibrillation: Secondary | ICD-10-CM | POA: Diagnosis not present

## 2018-02-07 DIAGNOSIS — I951 Orthostatic hypotension: Secondary | ICD-10-CM | POA: Diagnosis not present

## 2018-02-08 DIAGNOSIS — I482 Chronic atrial fibrillation: Secondary | ICD-10-CM | POA: Diagnosis not present

## 2018-02-08 DIAGNOSIS — I951 Orthostatic hypotension: Secondary | ICD-10-CM | POA: Diagnosis not present

## 2018-02-08 DIAGNOSIS — Z6826 Body mass index (BMI) 26.0-26.9, adult: Secondary | ICD-10-CM | POA: Diagnosis not present

## 2018-02-08 DIAGNOSIS — Z87891 Personal history of nicotine dependence: Secondary | ICD-10-CM | POA: Diagnosis not present

## 2018-02-08 DIAGNOSIS — Z7901 Long term (current) use of anticoagulants: Secondary | ICD-10-CM | POA: Diagnosis not present

## 2018-02-12 DIAGNOSIS — I951 Orthostatic hypotension: Secondary | ICD-10-CM | POA: Diagnosis not present

## 2018-02-12 DIAGNOSIS — Z6826 Body mass index (BMI) 26.0-26.9, adult: Secondary | ICD-10-CM | POA: Diagnosis not present

## 2018-02-12 DIAGNOSIS — Z7901 Long term (current) use of anticoagulants: Secondary | ICD-10-CM | POA: Diagnosis not present

## 2018-02-12 DIAGNOSIS — Z87891 Personal history of nicotine dependence: Secondary | ICD-10-CM | POA: Diagnosis not present

## 2018-02-12 DIAGNOSIS — I482 Chronic atrial fibrillation: Secondary | ICD-10-CM | POA: Diagnosis not present

## 2018-02-16 DIAGNOSIS — Z7901 Long term (current) use of anticoagulants: Secondary | ICD-10-CM | POA: Diagnosis not present

## 2018-02-16 DIAGNOSIS — I951 Orthostatic hypotension: Secondary | ICD-10-CM | POA: Diagnosis not present

## 2018-02-16 DIAGNOSIS — Z6826 Body mass index (BMI) 26.0-26.9, adult: Secondary | ICD-10-CM | POA: Diagnosis not present

## 2018-02-16 DIAGNOSIS — I482 Chronic atrial fibrillation: Secondary | ICD-10-CM | POA: Diagnosis not present

## 2018-02-16 DIAGNOSIS — Z87891 Personal history of nicotine dependence: Secondary | ICD-10-CM | POA: Diagnosis not present

## 2018-02-20 DIAGNOSIS — I482 Chronic atrial fibrillation: Secondary | ICD-10-CM | POA: Diagnosis not present

## 2018-02-20 DIAGNOSIS — I951 Orthostatic hypotension: Secondary | ICD-10-CM | POA: Diagnosis not present

## 2018-02-20 DIAGNOSIS — Z87891 Personal history of nicotine dependence: Secondary | ICD-10-CM | POA: Diagnosis not present

## 2018-02-20 DIAGNOSIS — Z6826 Body mass index (BMI) 26.0-26.9, adult: Secondary | ICD-10-CM | POA: Diagnosis not present

## 2018-02-20 DIAGNOSIS — Z7901 Long term (current) use of anticoagulants: Secondary | ICD-10-CM | POA: Diagnosis not present

## 2018-03-14 DIAGNOSIS — I482 Chronic atrial fibrillation: Secondary | ICD-10-CM | POA: Diagnosis not present

## 2018-03-14 DIAGNOSIS — Z6826 Body mass index (BMI) 26.0-26.9, adult: Secondary | ICD-10-CM | POA: Diagnosis not present

## 2018-03-14 DIAGNOSIS — I951 Orthostatic hypotension: Secondary | ICD-10-CM | POA: Diagnosis not present

## 2018-03-14 DIAGNOSIS — Z7901 Long term (current) use of anticoagulants: Secondary | ICD-10-CM | POA: Diagnosis not present

## 2018-03-14 DIAGNOSIS — Z87891 Personal history of nicotine dependence: Secondary | ICD-10-CM | POA: Diagnosis not present

## 2018-07-19 DIAGNOSIS — R5383 Other fatigue: Secondary | ICD-10-CM | POA: Diagnosis not present

## 2018-07-19 DIAGNOSIS — E782 Mixed hyperlipidemia: Secondary | ICD-10-CM | POA: Diagnosis not present

## 2018-07-19 DIAGNOSIS — I482 Chronic atrial fibrillation: Secondary | ICD-10-CM | POA: Diagnosis not present

## 2018-07-24 DIAGNOSIS — Z6825 Body mass index (BMI) 25.0-25.9, adult: Secondary | ICD-10-CM | POA: Diagnosis not present

## 2018-07-24 DIAGNOSIS — Z0001 Encounter for general adult medical examination with abnormal findings: Secondary | ICD-10-CM | POA: Diagnosis not present

## 2019-02-11 DIAGNOSIS — I4891 Unspecified atrial fibrillation: Secondary | ICD-10-CM | POA: Diagnosis not present

## 2019-02-11 DIAGNOSIS — D649 Anemia, unspecified: Secondary | ICD-10-CM | POA: Diagnosis not present

## 2019-02-11 DIAGNOSIS — K5900 Constipation, unspecified: Secondary | ICD-10-CM | POA: Diagnosis not present

## 2019-02-11 DIAGNOSIS — R32 Unspecified urinary incontinence: Secondary | ICD-10-CM | POA: Diagnosis not present

## 2019-02-11 DIAGNOSIS — H547 Unspecified visual loss: Secondary | ICD-10-CM | POA: Diagnosis not present

## 2019-02-11 DIAGNOSIS — Z803 Family history of malignant neoplasm of breast: Secondary | ICD-10-CM | POA: Diagnosis not present

## 2019-02-11 DIAGNOSIS — Z791 Long term (current) use of non-steroidal anti-inflammatories (NSAID): Secondary | ICD-10-CM | POA: Diagnosis not present

## 2019-02-11 DIAGNOSIS — I10 Essential (primary) hypertension: Secondary | ICD-10-CM | POA: Diagnosis not present

## 2019-02-11 DIAGNOSIS — L508 Other urticaria: Secondary | ICD-10-CM | POA: Diagnosis not present

## 2019-02-11 DIAGNOSIS — K219 Gastro-esophageal reflux disease without esophagitis: Secondary | ICD-10-CM | POA: Diagnosis not present

## 2019-05-24 ENCOUNTER — Inpatient Hospital Stay
Admission: RE | Admit: 2019-05-24 | Discharge: 2019-06-27 | Disposition: A | Discharge status: Home / Self Care | Payer: Medicare HMO | Source: Ambulatory Visit | Attending: Internal Medicine | Admitting: Internal Medicine

## 2019-05-24 DIAGNOSIS — X58XXXD Exposure to other specified factors, subsequent encounter: Secondary | ICD-10-CM | POA: Diagnosis not present

## 2019-05-24 DIAGNOSIS — Z4789 Encounter for other orthopedic aftercare: Secondary | ICD-10-CM | POA: Diagnosis not present

## 2019-05-24 DIAGNOSIS — R262 Difficulty in walking, not elsewhere classified: Secondary | ICD-10-CM | POA: Diagnosis not present

## 2019-05-24 DIAGNOSIS — R41841 Cognitive communication deficit: Secondary | ICD-10-CM | POA: Diagnosis not present

## 2019-05-24 DIAGNOSIS — I10 Essential (primary) hypertension: Secondary | ICD-10-CM | POA: Diagnosis not present

## 2019-05-24 DIAGNOSIS — F0151 Vascular dementia with behavioral disturbance: Secondary | ICD-10-CM | POA: Diagnosis not present

## 2019-05-24 DIAGNOSIS — L508 Other urticaria: Secondary | ICD-10-CM | POA: Diagnosis not present

## 2019-05-24 DIAGNOSIS — F339 Major depressive disorder, recurrent, unspecified: Secondary | ICD-10-CM | POA: Diagnosis not present

## 2019-05-24 DIAGNOSIS — S82109D Unspecified fracture of upper end of unspecified tibia, subsequent encounter for closed fracture with routine healing: Secondary | ICD-10-CM | POA: Diagnosis not present

## 2019-05-24 DIAGNOSIS — M6281 Muscle weakness (generalized): Secondary | ICD-10-CM | POA: Diagnosis not present

## 2019-05-24 DIAGNOSIS — K5909 Other constipation: Secondary | ICD-10-CM | POA: Diagnosis not present

## 2019-05-24 DIAGNOSIS — J3089 Other allergic rhinitis: Secondary | ICD-10-CM | POA: Diagnosis not present

## 2019-05-24 DIAGNOSIS — R69 Illness, unspecified: Secondary | ICD-10-CM | POA: Diagnosis not present

## 2019-05-24 DIAGNOSIS — M810 Age-related osteoporosis without current pathological fracture: Secondary | ICD-10-CM | POA: Diagnosis not present

## 2019-05-24 DIAGNOSIS — J302 Other seasonal allergic rhinitis: Secondary | ICD-10-CM | POA: Diagnosis not present

## 2019-05-24 DIAGNOSIS — N390 Urinary tract infection, site not specified: Secondary | ICD-10-CM | POA: Diagnosis not present

## 2019-05-24 DIAGNOSIS — S82141S Displaced bicondylar fracture of right tibia, sequela: Secondary | ICD-10-CM | POA: Diagnosis not present

## 2019-05-24 DIAGNOSIS — H548 Legal blindness, as defined in USA: Secondary | ICD-10-CM | POA: Diagnosis not present

## 2019-05-24 DIAGNOSIS — I480 Paroxysmal atrial fibrillation: Secondary | ICD-10-CM | POA: Diagnosis not present

## 2019-05-24 DIAGNOSIS — H903 Sensorineural hearing loss, bilateral: Secondary | ICD-10-CM | POA: Diagnosis not present

## 2019-05-24 DIAGNOSIS — Z741 Need for assistance with personal care: Secondary | ICD-10-CM | POA: Diagnosis not present

## 2019-05-24 DIAGNOSIS — D6959 Other secondary thrombocytopenia: Secondary | ICD-10-CM | POA: Diagnosis not present

## 2019-05-24 DIAGNOSIS — D696 Thrombocytopenia, unspecified: Secondary | ICD-10-CM | POA: Diagnosis not present

## 2019-05-24 DIAGNOSIS — M1711 Unilateral primary osteoarthritis, right knee: Secondary | ICD-10-CM | POA: Diagnosis not present

## 2019-05-27 ENCOUNTER — Encounter: Payer: Self-pay | Admitting: Adult Health

## 2019-05-27 ENCOUNTER — Non-Acute Institutional Stay (SKILLED_NURSING_FACILITY): Payer: Medicare HMO | Admitting: Adult Health

## 2019-05-27 DIAGNOSIS — S82141A Displaced bicondylar fracture of right tibia, initial encounter for closed fracture: Secondary | ICD-10-CM | POA: Insufficient documentation

## 2019-05-27 DIAGNOSIS — I10 Essential (primary) hypertension: Secondary | ICD-10-CM | POA: Diagnosis not present

## 2019-05-27 DIAGNOSIS — K5909 Other constipation: Secondary | ICD-10-CM | POA: Diagnosis not present

## 2019-05-27 DIAGNOSIS — I480 Paroxysmal atrial fibrillation: Secondary | ICD-10-CM | POA: Diagnosis not present

## 2019-05-27 DIAGNOSIS — J3089 Other allergic rhinitis: Secondary | ICD-10-CM | POA: Diagnosis not present

## 2019-05-27 DIAGNOSIS — S82141S Displaced bicondylar fracture of right tibia, sequela: Secondary | ICD-10-CM | POA: Diagnosis not present
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
## 2019-05-28 ENCOUNTER — Encounter: Payer: Self-pay | Admitting: Adult Health

## 2019-05-28 ENCOUNTER — Non-Acute Institutional Stay (SKILLED_NURSING_FACILITY): Payer: Medicare HMO | Admitting: Adult Health

## 2019-05-28 DIAGNOSIS — S82141S Displaced bicondylar fracture of right tibia, sequela: Secondary | ICD-10-CM

## 2019-05-28 DIAGNOSIS — D696 Thrombocytopenia, unspecified: Secondary | ICD-10-CM | POA: Diagnosis not present

## 2019-05-28 DIAGNOSIS — I480 Paroxysmal atrial fibrillation: Secondary | ICD-10-CM | POA: Diagnosis not present
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
## 2019-05-31 ENCOUNTER — Encounter (HOSPITAL_COMMUNITY)
Admission: RE | Admit: 2019-05-31 | Discharge: 2019-05-31 | Disposition: A | Discharge status: Home / Self Care | Payer: Medicare HMO | Source: Skilled Nursing Facility | Attending: Adult Health | Admitting: Adult Health

## 2019-05-31 DIAGNOSIS — X58XXXD Exposure to other specified factors, subsequent encounter: Secondary | ICD-10-CM | POA: Insufficient documentation

## 2019-05-31 DIAGNOSIS — S82109D Unspecified fracture of upper end of unspecified tibia, subsequent encounter for closed fracture with routine healing: Secondary | ICD-10-CM | POA: Insufficient documentation

## 2019-05-31 LAB — COMPREHENSIVE METABOLIC PANEL
ALT: 19 U/L (ref 0–44)
AST: 18 U/L (ref 15–41)
Albumin: 3.2 g/dL — ABNORMAL LOW (ref 3.5–5.0)
Alkaline Phosphatase: 58 U/L (ref 38–126)
Anion gap: 10 (ref 5–15)
BUN: 28 mg/dL — ABNORMAL HIGH (ref 8–23)
CO2: 29 mmol/L (ref 22–32)
Calcium: 9 mg/dL (ref 8.9–10.3)
Chloride: 102 mmol/L (ref 98–111)
Creatinine, Ser: 1.06 mg/dL (ref 0.61–1.24)
GFR calc Af Amer: >60 mL/min (ref >60)
GFR calc non Af Amer: >60 mL/min (ref >60)
Glucose, Bld: 106 mg/dL — ABNORMAL HIGH (ref 70–99)
Potassium: 4.5 mmol/L (ref 3.5–5.1)
Sodium: 141 mmol/L (ref 135–145)
Total Bilirubin: 0.9 mg/dL (ref 0.3–1.2)
Total Protein: 6.9 g/dL (ref 6.5–8.1)

## 2019-05-31 LAB — CBC
HCT: 41.5 % (ref 39.0–52.0)
Hemoglobin: 13 g/dL (ref 13.0–17.0)
MCH: 32.3 pg (ref 26.0–34.0)
MCHC: 31.3 g/dL (ref 30.0–36.0)
MCV: 103.2 fL — ABNORMAL HIGH (ref 80.0–100.0)
Platelets: 325 10*3/uL (ref 150–400)
RBC: 4.02 MIL/uL — ABNORMAL LOW (ref 4.22–5.81)
RDW: 12.2 % (ref 11.5–15.5)
WBC: 6.2 10*3/uL (ref 4.0–10.5)
nRBC: 0 % (ref 0.0–0.2)

## 2019-06-04 ENCOUNTER — Encounter: Payer: Self-pay | Admitting: Adult Health

## 2019-06-04 ENCOUNTER — Non-Acute Institutional Stay (SKILLED_NURSING_FACILITY): Payer: Medicare HMO | Admitting: Adult Health

## 2019-06-04 DIAGNOSIS — I10 Essential (primary) hypertension: Secondary | ICD-10-CM

## 2019-06-04 DIAGNOSIS — I480 Paroxysmal atrial fibrillation: Secondary | ICD-10-CM | POA: Diagnosis not present

## 2019-06-04 DIAGNOSIS — S82141S Displaced bicondylar fracture of right tibia, sequela: Secondary | ICD-10-CM | POA: Diagnosis not present
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
## 2019-06-06 ENCOUNTER — Encounter: Payer: Self-pay | Admitting: Internal Medicine

## 2019-06-06 ENCOUNTER — Non-Acute Institutional Stay (SKILLED_NURSING_FACILITY): Payer: Medicare HMO | Admitting: Internal Medicine

## 2019-06-06 DIAGNOSIS — L508 Other urticaria: Secondary | ICD-10-CM | POA: Insufficient documentation

## 2019-06-06 DIAGNOSIS — I480 Paroxysmal atrial fibrillation: Secondary | ICD-10-CM

## 2019-06-06 DIAGNOSIS — D696 Thrombocytopenia, unspecified: Secondary | ICD-10-CM | POA: Diagnosis not present

## 2019-06-06 DIAGNOSIS — S82141S Displaced bicondylar fracture of right tibia, sequela: Secondary | ICD-10-CM | POA: Diagnosis not present

## 2019-06-06 NOTE — Assessment & Plan Note (Signed)
Zyrtec at bedtime will not be discontinued unless it results in excessive somnolence or balance issues

## 2019-06-06 NOTE — Assessment & Plan Note (Signed)
Monitor for bleeding as he is on Xarelto prophylactically for PAF

## 2019-06-06 NOTE — Assessment & Plan Note (Addendum)
Clinically appears to be in atrial fib at this time but the rate is well controlled. Maintenance Xarelto prophylaxis with monitor of bleeding dyscrasias in context of TCP

## 2019-06-06 NOTE — Assessment & Plan Note (Signed)
PT/OT at SNF °

## 2019-06-06 NOTE — Patient Instructions (Signed)
See assessment and plan under each diagnosis in the problem list and acutely for this visit 

## 2019-06-06 NOTE — Progress Notes (Signed)
This is a comprehensive admission note to [facility] performed on this date less than 30 days from date of admission. Included are preadmission medical/surgical history;reconciled medication list; family history; social history and comprehensive review of systems.  Corrections and additions to the records were documented . Comprehensive physical exam was also performed. Additionally a clinical summary was entered for each active diagnosis pertinent to this admission in the Problem List to enhance continuity of care.  PCP: Veleta Miners MD  HPI: The patient was hospitalized at the [facility] 6/18-6/21/2020 for right tibial plateau fracture in the context of osteoporosis.  He was also noted to have a moderate to large knee joint effusion. He had fallen at home on 6/14.  The patient was living at home with support from his family.  He rarely would leave his home and when he did such he required a wheelchair. Apparently he had a mechanical fall tripping and falling backwards onto his buttocks without any cardiac or neurologic prodrome.  He actually went to the ED because of increasing pain in the right knee.  Plain films of the right leg revealed a right tibial plateau fracture.  Orthopedics recommended conservative management given his age and multiple comorbidities. He did have hematuria in the context of an INR of 2.5.  There over 100 red blood cells in the urine.  Xarelto prescribed for PAF was initially held but restarted on 6/20.  Mild thrombocytopenia was present with a platelet count of 103,000.  Past medical and surgical history: Includes posttraumatic blindness and hearing deficit.  Surgeries include resection of a melanoma, partial hip arthroplasty, inguinal hernia repair, and colonoscopy.  Social history: Nondrinker.  He has a history of 20 pack years of smoking  Family history: Extensive history reviewed.  Family history is noncontributory due to advanced age.  Review of  systems: Note: permanent blindness requires report by others of some signs//symptoms  He describes minimal pain in the leg at this time.  He states he does have intermittent shortness of breath and exertional dyspnea.  He also describes occasional diarrhea.  He states he has had chronic urticaria but this is well controlled with his present regimen.  Constitutional: No fever,significant weight change, fatigue  Eyes: No redness, discharge, pain ENT/mouth: No nasal congestion,  purulent discharge, earache,change in hearing ,sore throat  Cardiovascular: No chest pain, palpitations,paroxysmal nocturnal dyspnea, claudication, edema  Respiratory: No cough, sputum production,hemoptysis, significant snoring,apnea  Gastrointestinal: No heartburn,dysphagia,abdominal pain, nausea / vomiting,rectal bleeding, melena Genitourinary: No dysuria,hematuria, pyuria,  incontinence, nocturia Dermatologic: No new rash, change in appearance of skin Neurologic: No dizziness,headache,syncope, seizures, numbness , tingling Psychiatric: No significant anxiety , depression, insomnia, anorexia Endocrine: No change in hair/skin/ nails, excessive thirst, excessive hunger, excessive urination  Hematologic/lymphatic: No significant bruising, lymphadenopathy,abnormal bleeding Allergy/immunology: No sneezing, angioedema  Physical exam:  Pertinent or positive findings: He is communicative and interactive.  His speech pattern exhibits some stuttering and mumbled character. He is obviously very intelligent as he uses words such as facetious and expectoration.  Pattern alopecia is present.  He has bilateral hearing aids.  He is blind; the lenses are opaque.  He is missing the anterior maxillary teeth on the left.  Heart rate is slow and slightly irregular.  Breath sounds are decreased.  Dorsalis pedis pulses are very strong, posterior tibial pulses are palpable.  Strength opposition is excellent in the upper extremities.  It was not  tested in the lower extremities because of the fracture.  He has fusiform  changes of the knees without palpable effusion.  He has scattered vitiliginous changes of the upper extremities.  General appearance:Adequately nourished; no acute distress , increased work of breathing is present.   Lymphatic: No lymphadenopathy about the head, neck, axilla . Ears:  External ear exam shows no significant lesions or deformities.   Nose:  External nasal examination shows no deformity or inflammation. Nasal mucosa are pink and moist without lesions ,exudates Oral exam: lips and gums are healthy appearing.There is no oropharyngeal erythema or exudate . Neck:  No thyromegaly, masses, tenderness noted.    Heart:  No gallop, murmur, click, rub .  Lungs: without wheezes, rhonchi,rales , rubs. Abdomen:Bowel sounds are normal. Abdomen is soft and nontender with no organomegaly, hernias,masses. GU: deferred  Extremities:  No cyanosis, clubbing,edema  Neurologic exam : Balance,Rhomberg,finger to nose testing could not be completed due to clinical state Skin: Warm & dry w/o tenting. No significant rash.  See clinical summary under each active problem in the Problem List with associated updated therapeutic plan

## 2019-06-11 ENCOUNTER — Non-Acute Institutional Stay (SKILLED_NURSING_FACILITY): Payer: Medicare HMO | Admitting: Adult Health

## 2019-06-11 ENCOUNTER — Encounter: Payer: Self-pay | Admitting: Adult Health

## 2019-06-11 DIAGNOSIS — F339 Major depressive disorder, recurrent, unspecified: Secondary | ICD-10-CM

## 2019-06-11 DIAGNOSIS — N390 Urinary tract infection, site not specified: Secondary | ICD-10-CM | POA: Diagnosis not present

## 2019-06-11 DIAGNOSIS — K5909 Other constipation: Secondary | ICD-10-CM

## 2019-06-11 DIAGNOSIS — F01518 Vascular dementia, unspecified severity, with other behavioral disturbance: Secondary | ICD-10-CM

## 2019-06-11 DIAGNOSIS — R69 Illness, unspecified: Secondary | ICD-10-CM | POA: Diagnosis not present

## 2019-06-11 DIAGNOSIS — F0151 Vascular dementia with behavioral disturbance: Secondary | ICD-10-CM
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
## 2019-06-12 ENCOUNTER — Non-Acute Institutional Stay (SKILLED_NURSING_FACILITY): Payer: Medicare HMO | Admitting: Adult Health

## 2019-06-12 ENCOUNTER — Encounter: Payer: Self-pay | Admitting: Adult Health

## 2019-06-12 DIAGNOSIS — F339 Major depressive disorder, recurrent, unspecified: Secondary | ICD-10-CM | POA: Insufficient documentation

## 2019-06-12 DIAGNOSIS — R69 Illness, unspecified: Secondary | ICD-10-CM | POA: Diagnosis not present

## 2019-06-12 DIAGNOSIS — F0151 Vascular dementia with behavioral disturbance: Secondary | ICD-10-CM | POA: Insufficient documentation

## 2019-06-12 DIAGNOSIS — F01518 Vascular dementia, unspecified severity, with other behavioral disturbance: Secondary | ICD-10-CM | POA: Insufficient documentation

## 2019-06-12 DIAGNOSIS — I480 Paroxysmal atrial fibrillation: Secondary | ICD-10-CM | POA: Diagnosis not present

## 2019-06-12 DIAGNOSIS — S82141S Displaced bicondylar fracture of right tibia, sequela: Secondary | ICD-10-CM

## 2019-06-12 DIAGNOSIS — N390 Urinary tract infection, site not specified: Secondary | ICD-10-CM | POA: Insufficient documentation
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
## 2019-06-18 ENCOUNTER — Non-Acute Institutional Stay (SKILLED_NURSING_FACILITY): Payer: Medicare HMO | Admitting: Adult Health

## 2019-06-18 ENCOUNTER — Encounter: Payer: Self-pay | Admitting: Adult Health

## 2019-06-18 DIAGNOSIS — M1711 Unilateral primary osteoarthritis, right knee: Secondary | ICD-10-CM

## 2019-06-18 DIAGNOSIS — J3089 Other allergic rhinitis: Secondary | ICD-10-CM

## 2019-06-18 DIAGNOSIS — I10 Essential (primary) hypertension: Secondary | ICD-10-CM | POA: Diagnosis not present
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
## 2019-06-19 DIAGNOSIS — M1711 Unilateral primary osteoarthritis, right knee: Secondary | ICD-10-CM | POA: Insufficient documentation

## 2019-06-25 ENCOUNTER — Non-Acute Institutional Stay (SKILLED_NURSING_FACILITY): Payer: Medicare HMO | Admitting: Adult Health

## 2019-06-25 ENCOUNTER — Other Ambulatory Visit: Payer: Self-pay | Admitting: Adult Health

## 2019-06-25 ENCOUNTER — Encounter: Payer: Self-pay | Admitting: Adult Health

## 2019-06-25 DIAGNOSIS — R69 Illness, unspecified: Secondary | ICD-10-CM | POA: Diagnosis not present

## 2019-06-25 DIAGNOSIS — S82141S Displaced bicondylar fracture of right tibia, sequela: Secondary | ICD-10-CM

## 2019-06-25 DIAGNOSIS — F0151 Vascular dementia with behavioral disturbance: Secondary | ICD-10-CM

## 2019-06-25 DIAGNOSIS — I480 Paroxysmal atrial fibrillation: Secondary | ICD-10-CM | POA: Diagnosis not present

## 2019-06-25 DIAGNOSIS — F01518 Vascular dementia, unspecified severity, with other behavioral disturbance: Secondary | ICD-10-CM

## 2019-06-25 MED ORDER — METOPROLOL TARTRATE 25 MG PO TABS: 12.5000 mg | ORAL_TABLET | Freq: Two times a day (BID) | ORAL | 0 refills | Status: AC

## 2019-06-25 MED ORDER — MEMANTINE HCL ER 7 & 14 & 21 &28 MG PO CP24: 14.0000 mg | ORAL_CAPSULE | Freq: Every day | ORAL | 0 refills | Status: DC

## 2019-06-25 MED ORDER — MAGNESIUM OXIDE 400 MG PO TABS: 400.0000 mg | ORAL_TABLET | Freq: Every day | ORAL | 0 refills | Status: AC

## 2019-06-25 MED ORDER — RIVAROXABAN 20 MG PO TABS: 20.0000 mg | ORAL_TABLET | Freq: Every day | ORAL | 0 refills | Status: AC

## 2019-06-25 MED ORDER — SERTRALINE HCL 50 MG PO TABS: 50.0000 mg | ORAL_TABLET | Freq: Every day | ORAL | 0 refills | Status: DC
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
## 2019-11-21 DIAGNOSIS — Z03818 Encounter for observation for suspected exposure to other biological agents ruled out: Secondary | ICD-10-CM | POA: Diagnosis not present

## 2019-11-25 DIAGNOSIS — Z03818 Encounter for observation for suspected exposure to other biological agents ruled out: Secondary | ICD-10-CM | POA: Diagnosis not present

## 2019-12-02 DIAGNOSIS — Z03818 Encounter for observation for suspected exposure to other biological agents ruled out: Secondary | ICD-10-CM | POA: Diagnosis not present

## 2019-12-09 DIAGNOSIS — Z03818 Encounter for observation for suspected exposure to other biological agents ruled out: Secondary | ICD-10-CM | POA: Diagnosis not present

## 2019-12-16 DIAGNOSIS — Z03818 Encounter for observation for suspected exposure to other biological agents ruled out: Secondary | ICD-10-CM | POA: Diagnosis not present

## 2019-12-22 ENCOUNTER — Other Ambulatory Visit
Admission: RE | Admit: 2019-12-22 | Discharge: 2019-12-22 | Disposition: A | Discharge status: Home / Self Care | Payer: Medicare HMO | Source: Ambulatory Visit | Attending: Family Medicine | Admitting: Family Medicine

## 2019-12-22 DIAGNOSIS — R109 Unspecified abdominal pain: Secondary | ICD-10-CM | POA: Diagnosis not present

## 2019-12-22 LAB — URINALYSIS, COMPLETE (UACMP) WITH MICROSCOPIC
Bacteria, UA: NONE SEEN
Bilirubin Urine: NEGATIVE
Glucose, UA: NEGATIVE mg/dL
Ketones, ur: NEGATIVE mg/dL
Nitrite: POSITIVE — AB
Protein, ur: 30 mg/dL — AB
RBC / HPF: >50 RBC/hpf — ABNORMAL HIGH (ref 0–5)
Specific Gravity, Urine: 1.018 (ref 1.005–1.030)
Squamous Epithelial / HPF: NONE SEEN (ref 0–5)
WBC, UA: >50 WBC/hpf — ABNORMAL HIGH (ref 0–5)
pH: 6 (ref 5.0–8.0)

## 2019-12-23 DIAGNOSIS — Z03818 Encounter for observation for suspected exposure to other biological agents ruled out: Secondary | ICD-10-CM | POA: Diagnosis not present

## 2019-12-23 LAB — URINE CULTURE: Culture: NO GROWTH

## 2019-12-24 ENCOUNTER — Encounter: Payer: Self-pay | Admitting: Emergency Medicine

## 2019-12-24 ENCOUNTER — Emergency Department: Payer: No Typology Code available for payment source

## 2019-12-24 ENCOUNTER — Emergency Department
Admission: EM | Admit: 2019-12-24 | Discharge: 2019-12-24 | Disposition: A | Discharge status: Skilled Nursing Facility | Payer: No Typology Code available for payment source | Attending: Emergency Medicine | Admitting: Emergency Medicine

## 2019-12-24 ENCOUNTER — Other Ambulatory Visit: Payer: Self-pay

## 2019-12-24 DIAGNOSIS — Y999 Unspecified external cause status: Secondary | ICD-10-CM | POA: Diagnosis not present

## 2019-12-24 DIAGNOSIS — I480 Paroxysmal atrial fibrillation: Secondary | ICD-10-CM | POA: Diagnosis not present

## 2019-12-24 DIAGNOSIS — Z79899 Other long term (current) drug therapy: Secondary | ICD-10-CM | POA: Insufficient documentation

## 2019-12-24 DIAGNOSIS — F0390 Unspecified dementia without behavioral disturbance: Secondary | ICD-10-CM | POA: Insufficient documentation

## 2019-12-24 DIAGNOSIS — Z743 Need for continuous supervision: Secondary | ICD-10-CM | POA: Diagnosis not present

## 2019-12-24 DIAGNOSIS — Y929 Unspecified place or not applicable: Secondary | ICD-10-CM | POA: Insufficient documentation

## 2019-12-24 DIAGNOSIS — R404 Transient alteration of awareness: Secondary | ICD-10-CM | POA: Diagnosis not present

## 2019-12-24 DIAGNOSIS — S0083XA Contusion of other part of head, initial encounter: Secondary | ICD-10-CM | POA: Diagnosis not present

## 2019-12-24 DIAGNOSIS — N390 Urinary tract infection, site not specified: Secondary | ICD-10-CM | POA: Insufficient documentation

## 2019-12-24 DIAGNOSIS — Y9389 Activity, other specified: Secondary | ICD-10-CM | POA: Diagnosis not present

## 2019-12-24 DIAGNOSIS — W19XXXA Unspecified fall, initial encounter: Secondary | ICD-10-CM | POA: Diagnosis not present

## 2019-12-24 DIAGNOSIS — R279 Unspecified lack of coordination: Secondary | ICD-10-CM | POA: Diagnosis not present

## 2019-12-24 DIAGNOSIS — I10 Essential (primary) hypertension: Secondary | ICD-10-CM | POA: Insufficient documentation

## 2019-12-24 DIAGNOSIS — S0003XA Contusion of scalp, initial encounter: Secondary | ICD-10-CM | POA: Diagnosis not present

## 2019-12-24 DIAGNOSIS — Z7901 Long term (current) use of anticoagulants: Secondary | ICD-10-CM | POA: Insufficient documentation

## 2019-12-24 DIAGNOSIS — R0789 Other chest pain: Secondary | ICD-10-CM | POA: Diagnosis not present

## 2019-12-24 DIAGNOSIS — R69 Illness, unspecified: Secondary | ICD-10-CM | POA: Diagnosis not present

## 2019-12-24 DIAGNOSIS — R079 Chest pain, unspecified: Secondary | ICD-10-CM | POA: Diagnosis not present

## 2019-12-24 DIAGNOSIS — R4182 Altered mental status, unspecified: Secondary | ICD-10-CM | POA: Diagnosis not present

## 2019-12-24 LAB — COMPREHENSIVE METABOLIC PANEL
ALT: 23 U/L (ref 0–44)
AST: 28 U/L (ref 15–41)
Albumin: 3.4 g/dL — ABNORMAL LOW (ref 3.5–5.0)
Alkaline Phosphatase: 79 U/L (ref 38–126)
Anion gap: 11 (ref 5–15)
BUN: 18 mg/dL (ref 8–23)
CO2: 28 mmol/L (ref 22–32)
Calcium: 9.1 mg/dL (ref 8.9–10.3)
Chloride: 100 mmol/L (ref 98–111)
Creatinine, Ser: 0.84 mg/dL (ref 0.61–1.24)
GFR calc Af Amer: >60 mL/min (ref >60)
GFR calc non Af Amer: >60 mL/min (ref >60)
Glucose, Bld: 97 mg/dL (ref 70–99)
Potassium: 3.9 mmol/L (ref 3.5–5.1)
Sodium: 139 mmol/L (ref 135–145)
Total Bilirubin: 1.9 mg/dL — ABNORMAL HIGH (ref 0.3–1.2)
Total Protein: 6.9 g/dL (ref 6.5–8.1)

## 2019-12-24 LAB — CBC WITH DIFFERENTIAL/PLATELET
Abs Immature Granulocytes: 0.02 10*3/uL (ref 0.00–0.07)
Basophils Absolute: 0 10*3/uL (ref 0.0–0.1)
Basophils Relative: 0 %
Eosinophils Absolute: 0.1 10*3/uL (ref 0.0–0.5)
Eosinophils Relative: 3 %
HCT: 36.9 % — ABNORMAL LOW (ref 39.0–52.0)
Hemoglobin: 11.8 g/dL — ABNORMAL LOW (ref 13.0–17.0)
Immature Granulocytes: 0 %
Lymphocytes Relative: 27 %
Lymphs Abs: 1.3 10*3/uL (ref 0.7–4.0)
MCH: 30.4 pg (ref 26.0–34.0)
MCHC: 32 g/dL (ref 30.0–36.0)
MCV: 95.1 fL (ref 80.0–100.0)
Monocytes Absolute: 0.5 10*3/uL (ref 0.1–1.0)
Monocytes Relative: 10 %
Neutro Abs: 2.8 10*3/uL (ref 1.7–7.7)
Neutrophils Relative %: 60 %
Platelets: 129 10*3/uL — ABNORMAL LOW (ref 150–400)
RBC: 3.88 MIL/uL — ABNORMAL LOW (ref 4.22–5.81)
RDW: 15.9 % — ABNORMAL HIGH (ref 11.5–15.5)
WBC: 4.7 10*3/uL (ref 4.0–10.5)
nRBC: 0 % (ref 0.0–0.2)

## 2019-12-24 LAB — LACTIC ACID, PLASMA: Lactic Acid, Venous: 1.4 mmol/L (ref 0.5–1.9)

## 2019-12-24 LAB — URINALYSIS, COMPLETE (UACMP) WITH MICROSCOPIC
Bilirubin Urine: NEGATIVE
Glucose, UA: NEGATIVE mg/dL
Ketones, ur: NEGATIVE mg/dL
Nitrite: POSITIVE — AB
Protein, ur: NEGATIVE mg/dL
RBC / HPF: >50 RBC/hpf — ABNORMAL HIGH (ref 0–5)
Specific Gravity, Urine: 1.01 (ref 1.005–1.030)
Squamous Epithelial / HPF: NONE SEEN (ref 0–5)
pH: 7 (ref 5.0–8.0)

## 2019-12-24 LAB — TROPONIN I (HIGH SENSITIVITY)
Troponin I (High Sensitivity): 4 ng/L (ref <18)
Troponin I (High Sensitivity): 4 ng/L (ref <18)

## 2019-12-24 MED ORDER — LORAZEPAM 2 MG/ML IJ SOLN
0.5000 mg | Freq: Once | INTRAMUSCULAR | Status: AC
  Administered 2019-12-24: 22:00:00 0.5 mg via INTRAVENOUS
  Filled 2019-12-24: qty 1

## 2019-12-24 MED ORDER — SODIUM CHLORIDE 0.9 % IV SOLN
1.0000 g | Freq: Once | INTRAVENOUS | Status: AC
  Administered 2019-12-24: 1 g via INTRAVENOUS
  Filled 2019-12-24: qty 10

## 2019-12-24 MED ORDER — CEPHALEXIN 500 MG PO CAPS: 500.0000 mg | ORAL_CAPSULE | Freq: Two times a day (BID) | ORAL | 0 refills | Status: AC

## 2019-12-24 NOTE — ED Notes (Signed)
[facility] called for transport back to [facility]

## 2019-12-24 NOTE — Discharge Instructions (Addendum)
Curtis Mckinney chest pain work-up including EKG and cardiac enzymes was negative.  A CT of the head showed no intracranial bleeding or other acute findings.  The urinalysis from 2 days ago and today show findings concerning for possible urinary tract infection.  We have prescribed Keflex for outpatient treatment.  Curtis Mckinney should return to the ER for new or worsening altered mental status, weakness, fever, chest pain, shortness of breath, or any other new or worsening symptoms that concern facility staff.

## 2019-12-24 NOTE — ED Triage Notes (Signed)
Pt arrives from [facility] via [facility]. sstaff called EMS saying that pt verbaliizedd chest pain to them. Pt had fall on the 14th of the month w/ a hematoma on the rt side of forehead that has gotten more swollen over the last few days. Pt was nonverbal for EMS staff. Pt was uncooperative w/ EMS.

## 2019-12-24 NOTE — ED Notes (Signed)
Pt arrived from [facility] where staff stated he verbalized chest pain. Pt was not verbal with EMS staff and has not been verbal with ED staff other than Name/DOB and random ramblings.
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
## 2019-12-24 NOTE — ED Notes (Signed)
Called report to [facility] and spoke with pt's nurse Dwana Curd. Went over discharge instructions with her.

## 2019-12-30 DIAGNOSIS — Z03818 Encounter for observation for suspected exposure to other biological agents ruled out: Secondary | ICD-10-CM | POA: Diagnosis not present

## 2020-01-06 DIAGNOSIS — Z03818 Encounter for observation for suspected exposure to other biological agents ruled out: Secondary | ICD-10-CM | POA: Diagnosis not present

## 2020-01-13 DIAGNOSIS — Z03818 Encounter for observation for suspected exposure to other biological agents ruled out: Secondary | ICD-10-CM | POA: Diagnosis not present

## 2020-01-22 DIAGNOSIS — J984 Other disorders of lung: Secondary | ICD-10-CM | POA: Diagnosis not present

## 2020-01-23 ENCOUNTER — Emergency Department: Payer: No Typology Code available for payment source

## 2020-01-23 ENCOUNTER — Emergency Department
Admission: EM | Admit: 2020-01-23 | Discharge: 2020-01-23 | Disposition: A | Discharge status: Skilled Nursing Facility | Payer: No Typology Code available for payment source | Attending: Emergency Medicine | Admitting: Emergency Medicine

## 2020-01-23 DIAGNOSIS — I10 Essential (primary) hypertension: Secondary | ICD-10-CM | POA: Diagnosis not present

## 2020-01-23 DIAGNOSIS — I4891 Unspecified atrial fibrillation: Secondary | ICD-10-CM | POA: Diagnosis not present

## 2020-01-23 DIAGNOSIS — R404 Transient alteration of awareness: Secondary | ICD-10-CM | POA: Diagnosis not present

## 2020-01-23 DIAGNOSIS — J189 Pneumonia, unspecified organism: Secondary | ICD-10-CM | POA: Diagnosis not present

## 2020-01-23 DIAGNOSIS — I341 Nonrheumatic mitral (valve) prolapse: Secondary | ICD-10-CM | POA: Insufficient documentation

## 2020-01-23 DIAGNOSIS — R531 Weakness: Secondary | ICD-10-CM | POA: Diagnosis not present

## 2020-01-23 DIAGNOSIS — Z7901 Long term (current) use of anticoagulants: Secondary | ICD-10-CM | POA: Diagnosis not present

## 2020-01-23 DIAGNOSIS — R410 Disorientation, unspecified: Secondary | ICD-10-CM | POA: Diagnosis not present

## 2020-01-23 DIAGNOSIS — R918 Other nonspecific abnormal finding of lung field: Secondary | ICD-10-CM | POA: Diagnosis not present

## 2020-01-23 DIAGNOSIS — Z79899 Other long term (current) drug therapy: Secondary | ICD-10-CM | POA: Diagnosis not present

## 2020-01-23 DIAGNOSIS — F0390 Unspecified dementia without behavioral disturbance: Secondary | ICD-10-CM

## 2020-01-23 DIAGNOSIS — M2550 Pain in unspecified joint: Secondary | ICD-10-CM | POA: Diagnosis not present

## 2020-01-23 DIAGNOSIS — Z7401 Bed confinement status: Secondary | ICD-10-CM | POA: Diagnosis not present

## 2020-01-23 DIAGNOSIS — R4182 Altered mental status, unspecified: Secondary | ICD-10-CM | POA: Diagnosis not present

## 2020-01-23 DIAGNOSIS — Z03818 Encounter for observation for suspected exposure to other biological agents ruled out: Secondary | ICD-10-CM | POA: Diagnosis not present

## 2020-01-23 DIAGNOSIS — E860 Dehydration: Secondary | ICD-10-CM | POA: Diagnosis not present

## 2020-01-23 DIAGNOSIS — R4020 Unspecified coma: Secondary | ICD-10-CM | POA: Diagnosis not present

## 2020-01-23 LAB — CBC WITH DIFFERENTIAL/PLATELET
Abs Immature Granulocytes: 0.03 10*3/uL (ref 0.00–0.07)
Basophils Absolute: 0 10*3/uL (ref 0.0–0.1)
Basophils Relative: 1 %
Eosinophils Absolute: 0.1 10*3/uL (ref 0.0–0.5)
Eosinophils Relative: 2 %
HCT: 36.9 % — ABNORMAL LOW (ref 39.0–52.0)
Hemoglobin: 11.5 g/dL — ABNORMAL LOW (ref 13.0–17.0)
Immature Granulocytes: 1 %
Lymphocytes Relative: 26 %
Lymphs Abs: 1.3 10*3/uL (ref 0.7–4.0)
MCH: 29.9 pg (ref 26.0–34.0)
MCHC: 31.2 g/dL (ref 30.0–36.0)
MCV: 95.8 fL (ref 80.0–100.0)
Monocytes Absolute: 0.7 10*3/uL (ref 0.1–1.0)
Monocytes Relative: 14 %
Neutro Abs: 2.9 10*3/uL (ref 1.7–7.7)
Neutrophils Relative %: 56 %
Platelets: 124 10*3/uL — ABNORMAL LOW (ref 150–400)
RBC: 3.85 MIL/uL — ABNORMAL LOW (ref 4.22–5.81)
RDW: 17.2 % — ABNORMAL HIGH (ref 11.5–15.5)
WBC: 5.1 10*3/uL (ref 4.0–10.5)
nRBC: 0 % (ref 0.0–0.2)

## 2020-01-23 LAB — COMPREHENSIVE METABOLIC PANEL
ALT: 15 U/L (ref 0–44)
AST: 22 U/L (ref 15–41)
Albumin: 3.1 g/dL — ABNORMAL LOW (ref 3.5–5.0)
Alkaline Phosphatase: 77 U/L (ref 38–126)
Anion gap: 10 (ref 5–15)
BUN: 36 mg/dL — ABNORMAL HIGH (ref 8–23)
CO2: 28 mmol/L (ref 22–32)
Calcium: 8.8 mg/dL — ABNORMAL LOW (ref 8.9–10.3)
Chloride: 106 mmol/L (ref 98–111)
Creatinine, Ser: 1.09 mg/dL (ref 0.61–1.24)
GFR calc Af Amer: >60 mL/min (ref >60)
GFR calc non Af Amer: >60 mL/min (ref >60)
Glucose, Bld: 68 mg/dL — ABNORMAL LOW (ref 70–99)
Potassium: 4.3 mmol/L (ref 3.5–5.1)
Sodium: 144 mmol/L (ref 135–145)
Total Bilirubin: 2.7 mg/dL — ABNORMAL HIGH (ref 0.3–1.2)
Total Protein: 7.4 g/dL (ref 6.5–8.1)

## 2020-01-23 MED ORDER — BACITRACIN ZINC 500 UNIT/GM EX OINT: TOPICAL_OINTMENT | Freq: Two times a day (BID) | CUTANEOUS | Status: DC

## 2020-01-23 MED ORDER — BACITRACIN ZINC 500 UNIT/GM EX OINT
TOPICAL_OINTMENT | CUTANEOUS | Status: DC | PRN
  Filled 2020-01-23: qty 0.9

## 2020-01-23 MED ORDER — SODIUM CHLORIDE 0.9 % IV BOLUS
1000.0000 mL | Freq: Once | INTRAVENOUS | Status: AC
  Administered 2020-01-23: 15:00:00 1000 mL via INTRAVENOUS

## 2020-01-23 NOTE — ED Notes (Addendum)
MD notified of low-ish serum glucose of 68.  No new orders at this time.  Patient should remain NPO at the moment d/t aspiration risk.

## 2020-01-23 NOTE — ED Triage Notes (Signed)
Patient arrives via [facility] from [facility] with altered mental status and lethargy.  Patient non-verbal and not following commands.  Airway intact.  Will withdrawal to pain.  Afebrile, had a fall 2 weeks ago with hematoma to right forehead, and is currently being treated for pneumonia.
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
## 2020-01-23 NOTE — Discharge Instructions (Addendum)
Your labs, chest x-ray, CT scan of the head were all okay.  We gave IV fluids for dehydration.  Please hold Lasix for 2 days or until the patient is taking good oral intake.

## 2020-01-30 DIAGNOSIS — Z03818 Encounter for observation for suspected exposure to other biological agents ruled out: Secondary | ICD-10-CM | POA: Diagnosis not present

## 2020-02-27 DEATH — deceased

## 2020-03-27 IMAGING — CT CT HEAD W/O CM
3 of 4 series · 16 of 47 positions shown, 19 images · non-contrast
Comparison: 12/24/2019

CLINICAL DATA: Altered mental status

EXAM:
CT HEAD WITHOUT CONTRAST
TECHNIQUE: Contiguous axial images were obtained from the base of the skull
through the vertex without intravenous contrast.

[Series 2: head wo · axial · 0.47mm/px · z∈[-149,-14]mm · 10 of 34 slices shown, 13 images]
[im 4/34  brain]
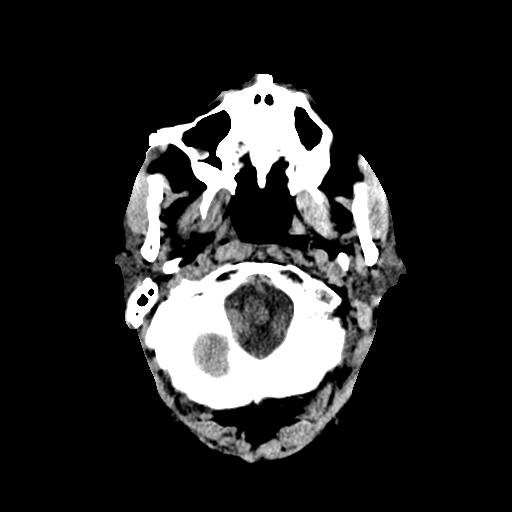
[im 4/34  bone]
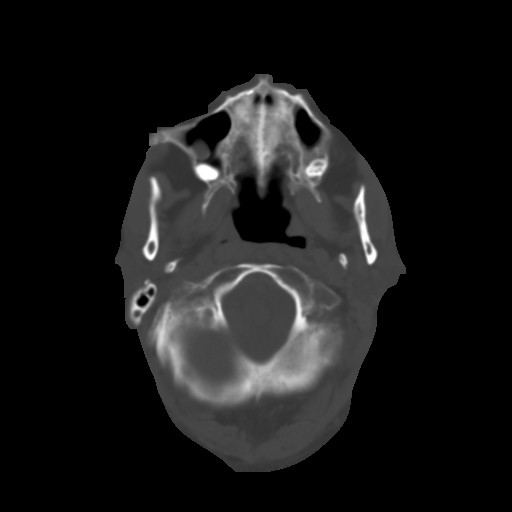
[im 7/34  brain]
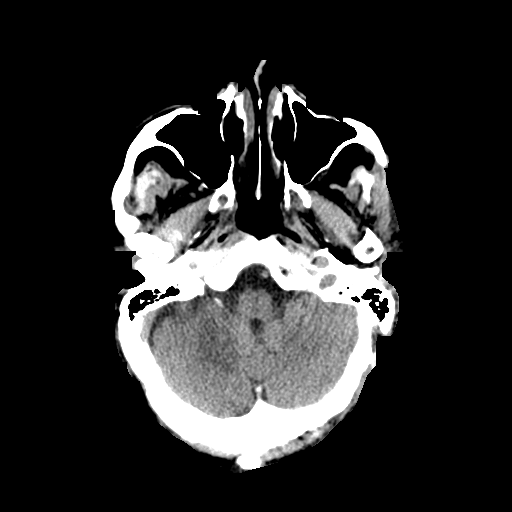
[im 10/34  brain]
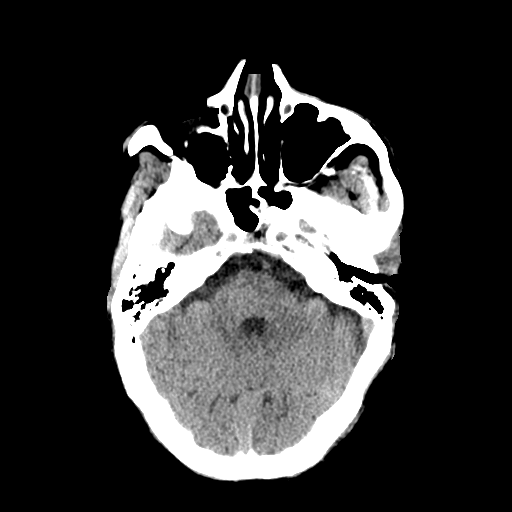
[im 13/34  brain]
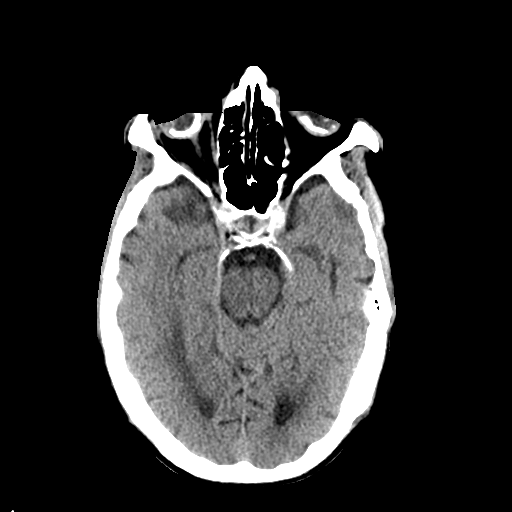
[im 16/34  brain]
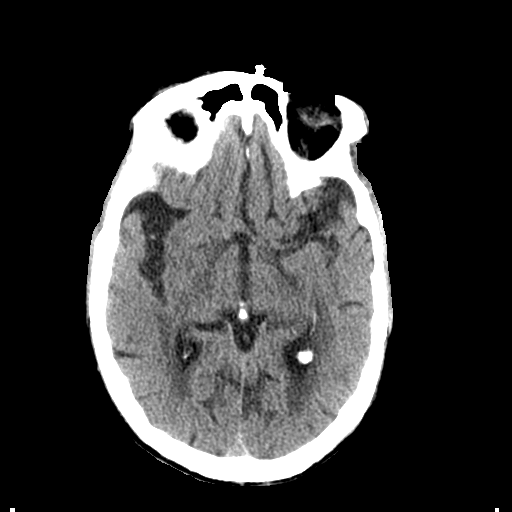
[im 16/34  bone]
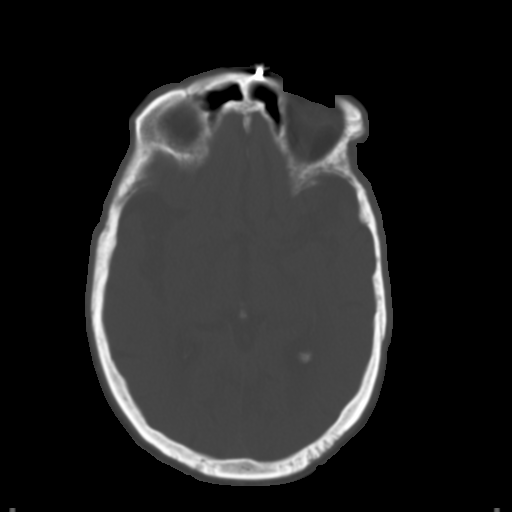
[im 19/34  brain]
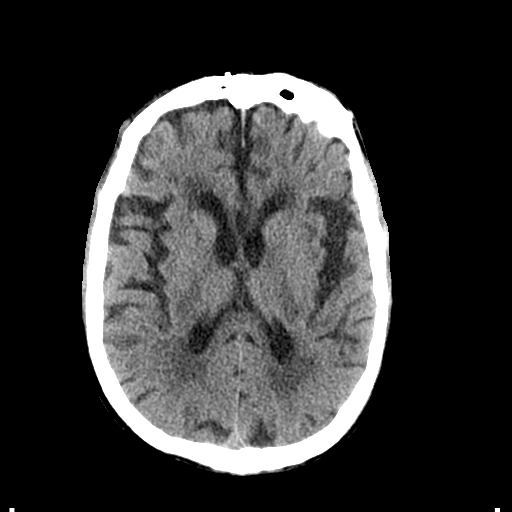
[im 22/34  brain]
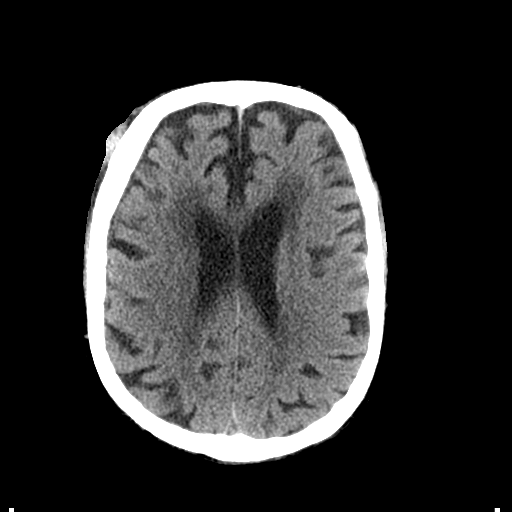
[im 25/34  brain]
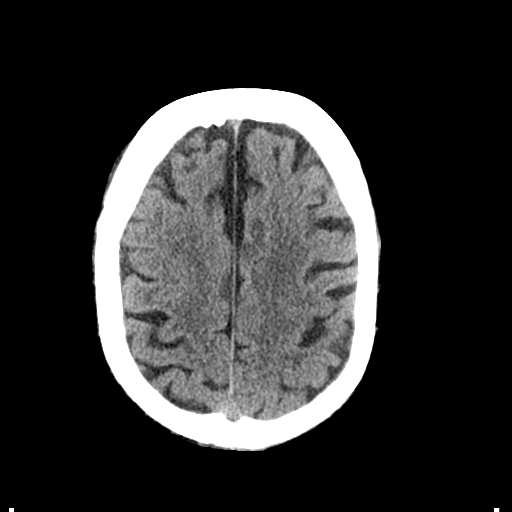
[im 28/34  brain]
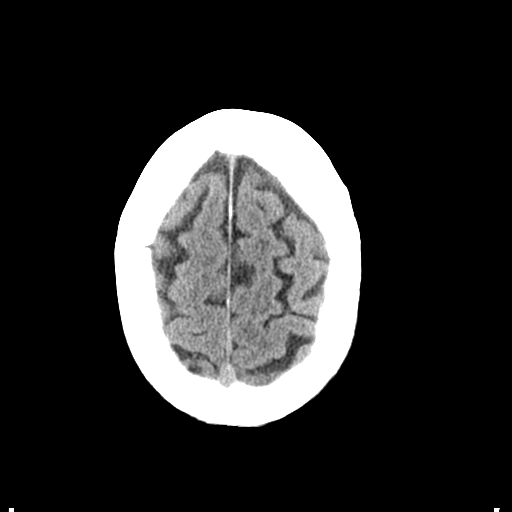
[im 28/34  bone]
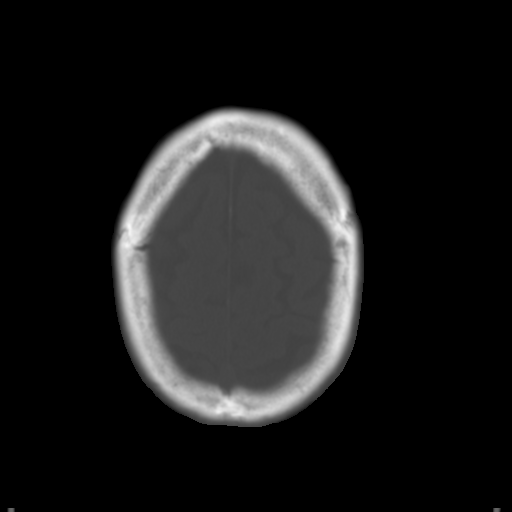
[im 31/34  brain]
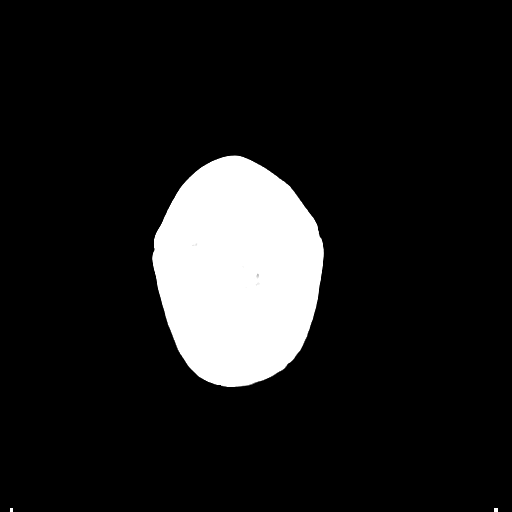

[Series 4: coronal soft tissue · coronal · 0.32mm/px · 3 of 71 slices shown]
[im 24/71  brain]
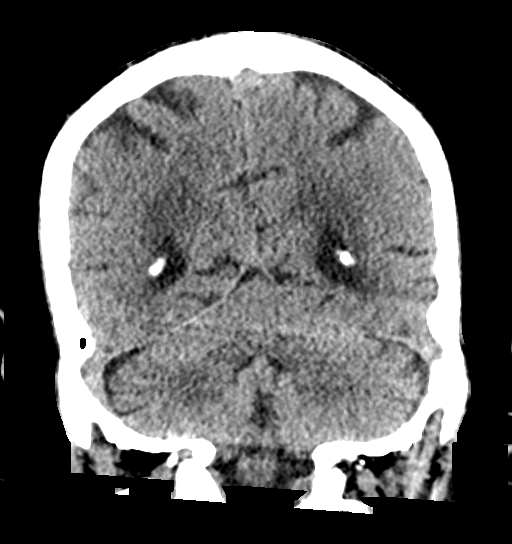
[im 32/71  brain]
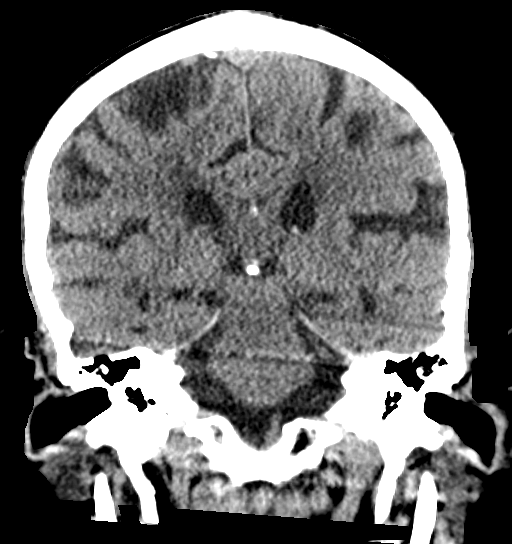
[im 39/71  brain]
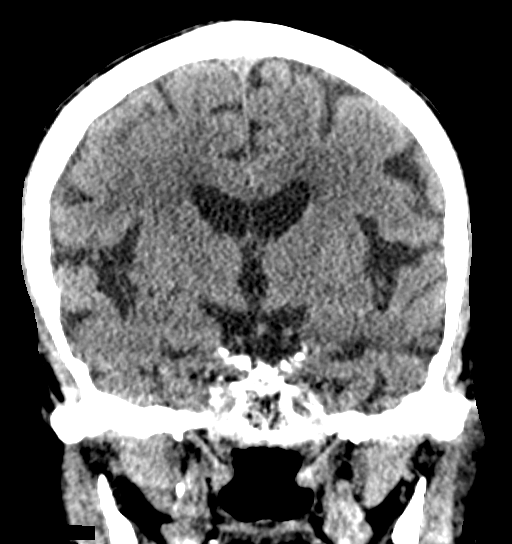

[Series 5: sagittal soft tissue · sagittal · 0.34mm/px · 3 of 55 slices shown]
[im 19/55  brain]
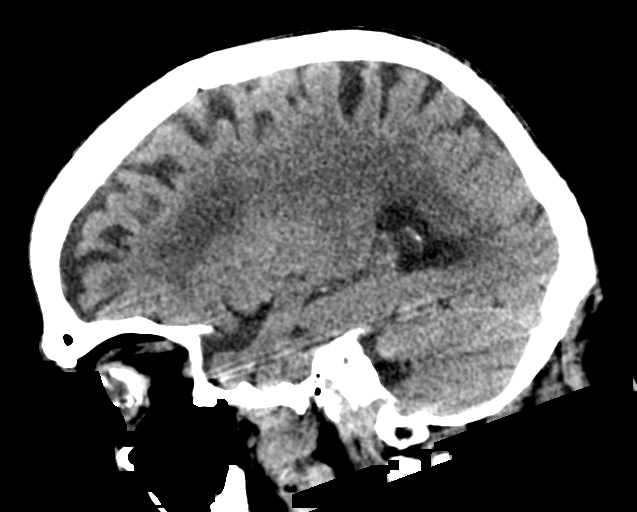
[im 28/55  brain]
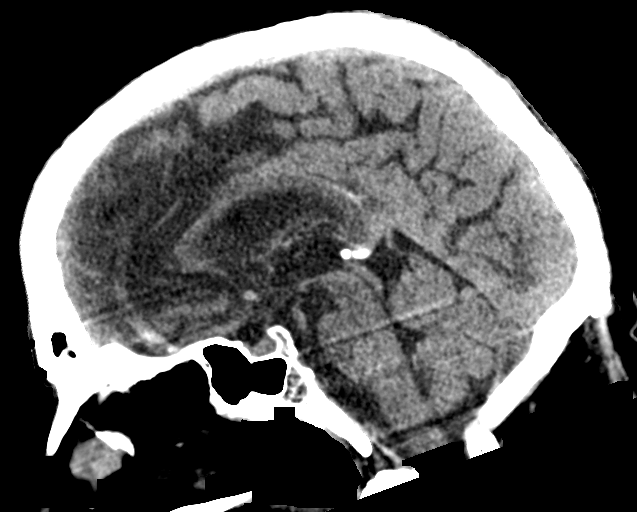
[im 37/55  brain]
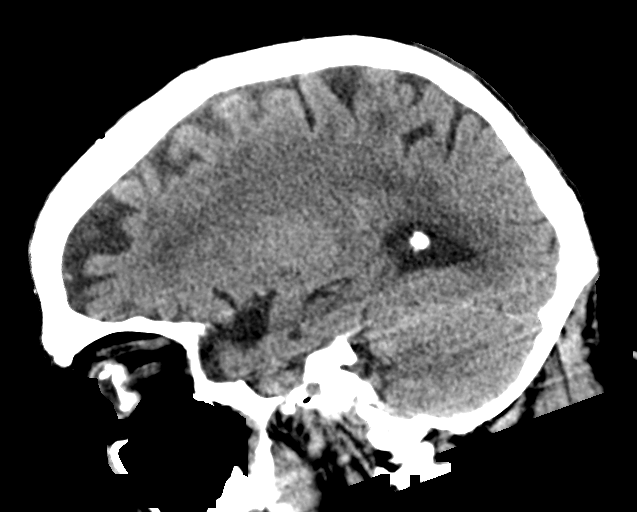

[16 of 47 positions shown; findings below may reference images not displayed]

FINDINGS: Brain: There is no acute intracranial hemorrhage, mass-effect, or
edema. There is no new loss of gray differentiation. There is no
extra-axial fluid collection. Ventricles are stable in size. Patchy
and confluent hypoattenuation in the supratentorial white matter
probably reflects stable chronic microvascular ischemic changes.

Vascular: There is atherosclerotic calcification at the skull base.

Skull: Calvarium is unremarkable.

Sinuses/Orbits: Mild mucosal thickening.  Bilateral phthisis bulbi.

Other: Persistent right frontal scalp hematoma with hyperdensity
suggesting rebleed.
IMPRESSION: No acute intracranial abnormality. Stable chronic findings detailed
above.

Persistent/recurrent right frontal scalp hematoma.
